# Patient Record
Sex: Female | Born: 1955
Health system: Southern US, Community
[De-identification: ages and names within clinical notes are randomized; demographics above are authoritative.]

## PROBLEM LIST (undated history)

## (undated) DIAGNOSIS — K222 Esophageal obstruction: Secondary | ICD-10-CM

## (undated) DIAGNOSIS — K5790 Diverticulosis of intestine, part unspecified, without perforation or abscess without bleeding: Secondary | ICD-10-CM

## (undated) DIAGNOSIS — F32A Depression, unspecified: Secondary | ICD-10-CM

## (undated) DIAGNOSIS — R413 Other amnesia: Secondary | ICD-10-CM

## (undated) DIAGNOSIS — K219 Gastro-esophageal reflux disease without esophagitis: Secondary | ICD-10-CM

## (undated) DIAGNOSIS — K449 Diaphragmatic hernia without obstruction or gangrene: Secondary | ICD-10-CM

## (undated) DIAGNOSIS — F99 Mental disorder, not otherwise specified: Secondary | ICD-10-CM

## (undated) DIAGNOSIS — E785 Hyperlipidemia, unspecified: Secondary | ICD-10-CM

## (undated) DIAGNOSIS — F329 Major depressive disorder, single episode, unspecified: Secondary | ICD-10-CM

## (undated) HISTORY — DX: Diverticulosis of intestine, part unspecified, without perforation or abscess without bleeding: K57.90

## (undated) HISTORY — DX: Gastro-esophageal reflux disease without esophagitis: K21.9

## (undated) HISTORY — DX: Esophageal obstruction: K22.2

## (undated) HISTORY — PX: BACK SURGERY: SHX140

## (undated) HISTORY — DX: Diaphragmatic hernia without obstruction or gangrene: K44.9

## (undated) HISTORY — PX: TUBAL LIGATION: SHX77

## (undated) HISTORY — PX: PATELLA FRACTURE SURGERY: SHX735

## (undated) HISTORY — DX: Hyperlipidemia, unspecified: E78.5

## (undated) HISTORY — DX: Other amnesia: R41.3

---
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
## 2007-04-03 ENCOUNTER — Encounter: Admission: RE | Admit: 2007-04-03 | Discharge: 2007-04-03 | Payer: Self-pay | Admitting: Family Medicine

## 2008-02-02 ENCOUNTER — Ambulatory Visit (HOSPITAL_COMMUNITY): Admission: RE | Admit: 2008-02-02 | Discharge: 2008-02-02 | Payer: Self-pay | Admitting: Internal Medicine

## 2008-02-14 ENCOUNTER — Ambulatory Visit (HOSPITAL_COMMUNITY): Admission: RE | Admit: 2008-02-14 | Discharge: 2008-02-14 | Payer: Self-pay | Admitting: Internal Medicine

## 2009-03-21 ENCOUNTER — Ambulatory Visit (HOSPITAL_COMMUNITY): Admission: RE | Admit: 2009-03-21 | Discharge: 2009-03-21 | Payer: Self-pay | Admitting: Family Medicine

## 2009-11-10 ENCOUNTER — Emergency Department (HOSPITAL_COMMUNITY): Admission: EM | Admit: 2009-11-10 | Discharge: 2009-11-10 | Payer: Self-pay | Admitting: Emergency Medicine

## 2010-10-18 ENCOUNTER — Encounter: Payer: Self-pay | Admitting: Internal Medicine

## 2011-06-08 ENCOUNTER — Other Ambulatory Visit (HOSPITAL_COMMUNITY): Payer: Self-pay | Admitting: Internal Medicine

## 2011-06-08 DIAGNOSIS — R1310 Dysphagia, unspecified: Secondary | ICD-10-CM

## 2011-06-08 DIAGNOSIS — Z139 Encounter for screening, unspecified: Secondary | ICD-10-CM

## 2011-06-17 ENCOUNTER — Ambulatory Visit (HOSPITAL_COMMUNITY)
Admission: RE | Admit: 2011-06-17 | Discharge: 2011-06-17 | Disposition: A | Discharge status: Home / Self Care | Payer: BC Managed Care – PPO | Source: Ambulatory Visit | Attending: Internal Medicine | Admitting: Internal Medicine

## 2011-06-17 DIAGNOSIS — R1310 Dysphagia, unspecified: Secondary | ICD-10-CM

## 2011-06-17 DIAGNOSIS — Z1231 Encounter for screening mammogram for malignant neoplasm of breast: Secondary | ICD-10-CM | POA: Insufficient documentation

## 2011-06-17 DIAGNOSIS — Z139 Encounter for screening, unspecified: Secondary | ICD-10-CM

## 2011-06-23 ENCOUNTER — Other Ambulatory Visit: Payer: Self-pay | Admitting: Internal Medicine

## 2011-06-23 DIAGNOSIS — R928 Other abnormal and inconclusive findings on diagnostic imaging of breast: Secondary | ICD-10-CM

## 2011-06-24 ENCOUNTER — Ambulatory Visit
Admission: RE | Admit: 2011-06-24 | Discharge: 2011-06-24 | Disposition: A | Discharge status: Home / Self Care | Payer: BC Managed Care – PPO | Source: Ambulatory Visit | Attending: Internal Medicine | Admitting: Internal Medicine

## 2011-06-24 DIAGNOSIS — R928 Other abnormal and inconclusive findings on diagnostic imaging of breast: Secondary | ICD-10-CM

## 2011-09-28 HISTORY — PX: COLONOSCOPY: SHX174

## 2011-10-19 ENCOUNTER — Other Ambulatory Visit (HOSPITAL_COMMUNITY): Payer: Self-pay | Admitting: Physician Assistant

## 2011-10-19 DIAGNOSIS — R1031 Right lower quadrant pain: Secondary | ICD-10-CM

## 2011-10-21 ENCOUNTER — Ambulatory Visit (HOSPITAL_COMMUNITY)
Admission: RE | Admit: 2011-10-21 | Discharge: 2011-10-21 | Disposition: A | Discharge status: Home / Self Care | Payer: BC Managed Care – PPO | Source: Ambulatory Visit | Attending: Physician Assistant | Admitting: Physician Assistant

## 2011-10-21 ENCOUNTER — Encounter (HOSPITAL_COMMUNITY): Payer: Self-pay

## 2011-10-21 ENCOUNTER — Other Ambulatory Visit: Payer: Self-pay

## 2011-10-21 ENCOUNTER — Telehealth: Payer: Self-pay

## 2011-10-21 DIAGNOSIS — Z139 Encounter for screening, unspecified: Secondary | ICD-10-CM

## 2011-10-21 DIAGNOSIS — K5730 Diverticulosis of large intestine without perforation or abscess without bleeding: Secondary | ICD-10-CM | POA: Insufficient documentation

## 2011-10-21 DIAGNOSIS — R1031 Right lower quadrant pain: Secondary | ICD-10-CM | POA: Insufficient documentation

## 2011-10-21 DIAGNOSIS — R197 Diarrhea, unspecified: Secondary | ICD-10-CM | POA: Insufficient documentation

## 2011-10-21 MED ORDER — IOHEXOL 300 MG/ML  SOLN
100.0000 mL | Freq: Once | INTRAMUSCULAR | Status: AC | PRN
  Administered 2011-10-21: 100 mL via INTRAVENOUS

## 2011-10-21 NOTE — Telephone Encounter (Signed)
Reviewed CT and barium esophagram results from last three months. No significant findings.  Patient declines OV prior to TCS.  OK to schedule.

## 2011-10-21 NOTE — Telephone Encounter (Signed)
Gastroenterology Pre-Procedure Form   Request Date: 10/21/2011     Requesting Physician: Dr. Phillips Odor     PATIENT INFORMATION:  Sylvia [name] is a 56 y.o., female (DOB=02-16-1956).  PROCEDURE: Procedure(s) requested: colonoscopy Procedure Reason: screening for colon cancer  PATIENT REVIEW QUESTIONS: The patient reports the following:   1. Diabetes Melitis: no 2. Joint replacements in the past 12 months: no 3. Major health problems in the past 3 months: Yes / abdominal pain/ still having tests done ordered by [facility] physicians (I offered OV prior to tcs for abd pain and she declined/ said [place] doctors are taking care of that 4. Has an artificial valve or MVP:no 5. Has been advised in past to take antibiotics in advance of a procedure like teeth cleaning: no}    MEDICATIONS & ALLERGIES:    Patient reports the following regarding taking any blood thinners:   Plavix? no Aspirin?no Coumadin?  no  Patient confirms/reports the following medications:  Current Outpatient Prescriptions  Medication Sig Dispense Refill  . ciprofloxacin (CIPRO) 500 MG tablet Take 500 mg by mouth 2 (two) times daily. Pt on Cipro now for some stomach problems      . FLUoxetine (PROZAC) 20 MG capsule Take 20 mg by mouth daily.      Marland Kitchen omeprazole (PRILOSEC) 20 MG capsule Take 20 mg by mouth daily.      . rosuvastatin (CRESTOR) 40 MG tablet Take 40 mg by mouth daily.        Patient confirms/reports the following allergies:  No Known Allergies  Patient is appropriate to schedule for requested procedure(s): yes  AUTHORIZATION INFORMATION Primary Insurance:   ID #:   Group #:  Pre-Cert / Auth required Pre-Cert / Auth #:   Secondary Insurance: ID #:   Group #: Pre-Cert / Auth required: Pre-Cert / Auth #:  No orders of the defined types were placed in this encounter.    SCHEDULE INFORMATION: Procedure has been scheduled as follows:  Date: 10/27/2011             Time: 12:15 PM  Location: [facility] Short Stay  This Gastroenterology Pre-Precedure Form is being routed to the following provider(s) for review: R. Roetta Sessions, MD

## 2011-10-22 ENCOUNTER — Encounter (HOSPITAL_COMMUNITY): Payer: Self-pay | Admitting: Pharmacy Technician

## 2011-10-22 NOTE — Telephone Encounter (Signed)
Faxing Rx and instructions to [facility] in [place].

## 2011-10-26 MED ORDER — SODIUM CHLORIDE 0.45 % IV SOLN
Freq: Once | INTRAVENOUS | Status: AC
  Administered 2011-10-27: 1000 mL via INTRAVENOUS

## 2011-10-27 ENCOUNTER — Encounter (HOSPITAL_COMMUNITY): Payer: Self-pay | Admitting: *Deleted

## 2011-10-27 ENCOUNTER — Ambulatory Visit (HOSPITAL_COMMUNITY)
Admission: RE | Admit: 2011-10-27 | Discharge: 2011-10-27 | Disposition: A | Discharge status: Home / Self Care | Payer: BC Managed Care – PPO | Source: Ambulatory Visit | Attending: Internal Medicine | Admitting: Internal Medicine

## 2011-10-27 ENCOUNTER — Encounter (HOSPITAL_COMMUNITY)
Admission: RE | Disposition: A | Discharge status: Home / Self Care | Payer: Self-pay | Source: Ambulatory Visit | Attending: Internal Medicine

## 2011-10-27 DIAGNOSIS — K5730 Diverticulosis of large intestine without perforation or abscess without bleeding: Secondary | ICD-10-CM

## 2011-10-27 DIAGNOSIS — Z1211 Encounter for screening for malignant neoplasm of colon: Secondary | ICD-10-CM

## 2011-10-27 DIAGNOSIS — Z139 Encounter for screening, unspecified: Secondary | ICD-10-CM

## 2011-10-27 HISTORY — DX: Depression, unspecified: F32.A

## 2011-10-27 HISTORY — DX: Mental disorder, not otherwise specified: F99

## 2011-10-27 HISTORY — PX: COLONOSCOPY: SHX5424

## 2011-10-27 HISTORY — DX: Major depressive disorder, single episode, unspecified: F32.9

## 2011-10-27 SURGERY — COLONOSCOPY
Anesthesia: Moderate Sedation

## 2011-10-27 MED ORDER — MIDAZOLAM HCL 5 MG/5ML IJ SOLN
INTRAMUSCULAR | Status: AC
  Filled 2011-10-27: qty 10

## 2011-10-27 MED ORDER — MEPERIDINE HCL 100 MG/ML IJ SOLN
INTRAMUSCULAR | Status: DC | PRN
  Administered 2011-10-27: 25 mg via INTRAVENOUS
  Administered 2011-10-27 (×2): 50 mg via INTRAVENOUS

## 2011-10-27 MED ORDER — MIDAZOLAM HCL 5 MG/5ML IJ SOLN
INTRAMUSCULAR | Status: DC | PRN
  Administered 2011-10-27: 1 mg via INTRAVENOUS
  Administered 2011-10-27 (×2): 2 mg via INTRAVENOUS
  Administered 2011-10-27: 1 mg via INTRAVENOUS

## 2011-10-27 MED ORDER — MEPERIDINE HCL 100 MG/ML IJ SOLN
INTRAMUSCULAR | Status: AC
  Filled 2011-10-27: qty 2

## 2011-10-27 NOTE — H&P (Signed)
  Primary Care Physician:  Cassell Smiles., MD, MD Primary Gastroenterologist:  Dr.   Pre-Procedure History & Physical: HPI:  Sylvia [name] is a 56 y.o. female is here for a screening colonoscopy.   Past Medical History  Diagnosis Date  . S/P appendectomy 1973  . S/P tubal ligation   . Mental disorder   . Depression     Past Surgical History  Procedure Date  . Appendectomy   . Patella fracture surgery     Prior to Admission medications   Medication Sig Start Date End Date Taking? Authorizing Provider  ciprofloxacin (CIPRO) 500 MG tablet Take 500 mg by mouth 2 (two) times daily. Pt on Cipro now for some stomach problems 10/19/11 10/29/11 Yes Historical Provider, MD  FLUoxetine (PROZAC) 20 MG capsule Take 20 mg by mouth daily.   Yes Historical Provider, MD  ibuprofen (ADVIL,MOTRIN) 200 MG tablet Take 400 mg by mouth every 6 (six) hours as needed. For pain   Yes Historical Provider, MD  omeprazole (PRILOSEC) 20 MG capsule Take 20 mg by mouth daily.   Yes Historical Provider, MD  rosuvastatin (CRESTOR) 40 MG tablet Take 40 mg by mouth every evening.    Yes Historical Provider, MD    Allergies as of 10/21/2011  . (No Known Allergies)    History reviewed. No pertinent family history.  History   Social History  . Marital Status: Married    Spouse Name: N/A    Number of Children: N/A  . Years of Education: N/A   Occupational History  . Not on file.   Social History Main Topics  . Smoking status: Current Everyday Smoker -- 0.2 packs/day for 15 years    Types: Cigarettes  . Smokeless tobacco: Not on file  . Alcohol Use: 1.2 oz/week    2 Shots of liquor per week  . Drug Use: No  . Sexually Active:    Other Topics Concern  . Not on file   Social History Narrative  . No narrative on file    Review of Systems: See HPI, otherwise negative ROS  Physical Exam: BP 102/70  Pulse 76  Temp(Src) 98.4 F (36.9 C) (Oral)  Resp 20  SpO2 94% General:   Alert,   Well-developed, well-nourished, pleasant and cooperative in NAD Head:  Normocephalic and atraumatic. Eyes:  Sclera clear, no icterus.   Conjunctiva pink. Ears:  Normal auditory acuity. Nose:  No deformity, discharge,  or lesions. Mouth:  No deformity or lesions, dentition normal. Neck:  Supple; no masses or thyromegaly. Lungs:  Clear throughout to auscultation.   No wheezes, crackles, or rhonchi. No acute distress. Heart:  Regular rate and rhythm; no murmurs, clicks, rubs,  or gallops. Abdomen:  Soft, nontender and nondistended. No masses, hepatosplenomegaly or hernias noted. Normal bowel sounds, without guarding, and without rebound.   Msk:  Symmetrical without gross deformities. Normal posture. Pulses:  Normal pulses noted. Extremities:  Without clubbing or edema. Neurologic:  Alert and  oriented x4;  grossly normal neurologically. Skin:  Intact without significant lesions or rashes. Cervical Nodes:  No significant cervical adenopathy. Psych:  Alert and cooperative. Normal mood and affect.  Impression/Plan: Sylvia [name] is now here to undergo a screening colonoscopy.  Risks, benefits, limitations, imponderables and alternatives regarding colonoscopy have been reviewed with the patient. Questions have been answered. All parties agreeable.

## 2011-10-27 NOTE — H&P (Signed)
Primary Care Physician:  Cassell Smiles., MD, MD Primary Gastroenterologist:  Dr.   Pre-Procedure History & Physical: HPI:  Sylvia [name] is a 56 y.o. female is here for a screening colonoscopy.  First ever had her screening colonoscopy. No blood per rectum, etc. Patient has had some right upper quadrant abdominal pain from time to time. She is being evaluated at [facility]. Recent CT scan demonstrated diverticulosis but nothing acute no evidence of neoplasm. For the record, she was offered an office visit prior to colonoscopy last week with [place] for her  abdominal pain , however, she declined.  Past Medical History  Diagnosis Date  . S/P appendectomy 1973  . S/P tubal ligation   . Mental disorder   . Depression     Past Surgical History  Procedure Date  . Appendectomy   . Patella fracture surgery     Prior to Admission medications   Medication Sig Start Date End Date Taking? Authorizing Provider  ciprofloxacin (CIPRO) 500 MG tablet Take 500 mg by mouth 2 (two) times daily. Pt on Cipro now for some stomach problems 10/19/11 10/29/11 Yes Historical Provider, MD  FLUoxetine (PROZAC) 20 MG capsule Take 20 mg by mouth daily.   Yes Historical Provider, MD  ibuprofen (ADVIL,MOTRIN) 200 MG tablet Take 400 mg by mouth every 6 (six) hours as needed. For pain   Yes Historical Provider, MD  omeprazole (PRILOSEC) 20 MG capsule Take 20 mg by mouth daily.   Yes Historical Provider, MD  rosuvastatin (CRESTOR) 40 MG tablet Take 40 mg by mouth every evening.    Yes Historical Provider, MD    Allergies as of 10/21/2011  . (No Known Allergies)    History reviewed. No pertinent family history.  History   Social History  . Marital Status: Married    Spouse Name: N/A    Number of Children: N/A  . Years of Education: N/A   Occupational History  . Not on file.   Social History Main Topics  . Smoking status: Current Everyday Smoker -- 0.2 packs/day for 15 years    Types:  Cigarettes  . Smokeless tobacco: Not on file  . Alcohol Use: 1.2 oz/week    2 Shots of liquor per week  . Drug Use: No  . Sexually Active:    Other Topics Concern  . Not on file   Social History Narrative  . No narrative on file    Review of Systems: See HPI, otherwise negative ROS  Physical Exam: BP 102/70  Pulse 76  Temp(Src) 98.4 F (36.9 C) (Oral)  Resp 20  SpO2 94% General:   Alert,  Well-developed, well-nourished, pleasant and cooperative in NAD Head:  Normocephalic and atraumatic. Eyes:  Sclera clear, no icterus.   Conjunctiva pink. Ears:  Normal auditory acuity. Nose:  No deformity, discharge,  or lesions. Mouth:  No deformity or lesions, dentition normal. Neck:  Supple; no masses or thyromegaly. Lungs:  Clear throughout to auscultation.   No wheezes, crackles, or rhonchi. No acute distress. Heart:  Regular rate and rhythm; no murmurs, clicks, rubs,  or gallops. Abdomen:  Nondistended. no rash. Positive bowels soft with minimal right upper quadrant abdominal tenderness without mass or organomegaly Msk:  Symmetrical without gross deformities. Normal posture. Pulses:  Normal pulses noted. Extremities:  Without clubbing or edema. Neurologic:  Alert and  oriented x4;  grossly normal neurologically. Skin:  Intact without significant lesions or rashes. Cervical Nodes:  No significant cervical adenopathy. Psych:  Alert and  cooperative. Normal mood and affect.  Impression/Plan: [name] is now here to undergo a screening colonoscopy.  First ever examination.No family history of colon polyps or colon cancer. Recent CT as outlined above. Recent right-sided abdominal pain- etiology not well defined at this time. She wishes to have a colonoscopy but also wishes for her abdominal pain to be worked up further. Regardless of abdominal pain etiology, given CT findings recently, I see no contraindication to proceeding with a screening colonoscopy given that she took her prep  yesterday.  The risks, benefits, limitations, imponderables and alternatives regarding colonoscopy have been reviewed with the patient. Questions have been answered. All parties agreeable.

## 2011-10-27 NOTE — H&P (Signed)
  I have seen & examined the patient prior to the procedure(s) today and reviewed the history and physical/consultation.  There have been no changes.  After consideration of the risks, benefits, alternatives and imponderables, the patient has consented to the procedure(s).   

## 2011-10-27 NOTE — Op Note (Signed)
[facility] [address] [place], [place]  [address]  COLONOSCOPY PROCEDURE REPORT  PATIENT:  Sylvia [name], Sylvia [name]  MR#:  [id number] BIRTHDATE:  07-29-1956, 55 yrs. old  GENDER:  female ENDOSCOPIST:  R. Roetta Sessions, MD FACP [facility] REF. BY:  Artis Delay, M.D. PROCEDURE DATE:  10/27/2011 PROCEDURE:  Colorectal cancer screening INDICATIONS:  First-ever average risk screening examination  INFORMED CONSENT:  The risks, benefits, alternatives and imponderables including but not limited to bleeding, perforation as well as the possibility of a missed lesion have been reviewed. The potential for biopsy, lesion removal, etc. have also been discussed.  Questions have been answered.  All parties agreeable. Please see the history and physical in the medical record for more information.  MEDICATIONS:  Versed 6 mg IV and Demerol 25 mg IV in divided doses  DESCRIPTION OF PROCEDURE:  After a digital rectal exam was performed, the EC-3890Li ([id number]) and EC-3890Li ([id number]) colonoscope was advanced from the anus through the rectum and colon to the area of the cecum, ileocecal valve and appendiceal orifice.  The cecum was deeply intubated.  These structures were well-seen and photographed for the record.  From the level of the cecum and ileocecal valve, the scope was slowly and cautiously withdrawn.  The mucosal surfaces were carefully surveyed utilizing scope tip deflection to facilitate fold flattening as needed.  The scope was pulled down into the rectum where a thorough examination including retroflexion was performed. <<PROCEDUREIMAGES>>  FINDINGS: Adequate preparation. Normal rectum. Scattered sigmoid and descending diverticula; remainder of colonic mucosa as well as the distal         10 cm of terminal ileum mucosa appeared normal  THERAPEUTIC / DIAGNOSTIC MANEUVERS PERFORMED:  None  COMPLICATIONS:  None  CECAL WITHDRAWAL TIME: 12 minutes  IMPRESSION:    Colonic  diverticulosis  RECOMMENDATIONS:    Repeat screening colonoscopy 10 years.  ______________________________ R. Roetta Sessions, MD Caleen Essex  CC:  Artis Delay, M.D.  n. eSIGNED:   R. Roetta Sessions at 10/27/2011 02:28 PM  Georgeanna Lea, [phone]

## 2011-10-28 ENCOUNTER — Ambulatory Visit (INDEPENDENT_AMBULATORY_CARE_PROVIDER_SITE_OTHER): Payer: BC Managed Care – PPO | Admitting: Gastroenterology

## 2011-10-28 ENCOUNTER — Telehealth: Payer: Self-pay | Admitting: Gastroenterology

## 2011-10-28 ENCOUNTER — Encounter: Payer: Self-pay | Admitting: Gastroenterology

## 2011-10-28 DIAGNOSIS — K219 Gastro-esophageal reflux disease without esophagitis: Secondary | ICD-10-CM | POA: Insufficient documentation

## 2011-10-28 DIAGNOSIS — R1013 Epigastric pain: Secondary | ICD-10-CM | POA: Insufficient documentation

## 2011-10-28 DIAGNOSIS — R1011 Right upper quadrant pain: Secondary | ICD-10-CM | POA: Insufficient documentation

## 2011-10-28 NOTE — Assessment & Plan Note (Signed)
Right sided abd pain. Mostly ruq on exam today. Postprandial and positional component to her symptoms. CT A/P failed to show cause of her pain. No obvious abdominal hernia although cannot exclude spigelian hernia. She appreciates more pp component now than previously. No significant colonoscopy findings to explain symptoms.   Discussed with Dr. Jena Gauss. Gallbladder w/u including abd u/s.   Retrieve any labwork done at PCP.

## 2011-10-28 NOTE — Assessment & Plan Note (Signed)
Chronic GERD. No prior EGD. Consider EGD at later date to r/o complication such as Barrett's esophagus. Continue omeprazole.

## 2011-10-28 NOTE — Telephone Encounter (Signed)
Pt is scheduled for [place] on 02/05 @ 10am- she is aware to be NPO after midnight

## 2011-10-28 NOTE — Progress Notes (Signed)
Primary Care Physician: FUSCO,LAWRENCE J., MD, MD  Primary Gastroenterologist:  Michael Rourk, MD   Chief Complaint  Patient presents with  . Abdominal Pain    HPI: Sylvia [name] is a 55 y.o. female here for further evaluation of abdominal pain. She was seen yesterday for the first time, at time of screening colonoscopy. We received her referral forms previously and offered her an appointment as she states she was having abdominal pain. She informed us that she had had multiple test by her PCP already and just needed screening colonoscopy.   On colonoscopy yesterday, she had diverticulosis.   She c/o RLQ pain. She has noted tenderness at her appendectomy scar for nearly one year but since 05/2011 she has had increasing pain. 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 Laying down seems to alleviate it at times. Now however, she has noted a pp component. Pain has migrated more to right mid-abd to RUQ. No n/v. No change in bowels. No melena, brbpr. In 05/2011 she was having difficulty swallowing. Barium swallow failed to show any signficant problems. She saw ENT who told her she had signficant amount of mucous in her nasal passages. Treated with abx for two weeks and symptoms resolved. Chronic GERD for over 20 years. No prior EGD. She feels like she has RLQ bulging. Notes belly fat which she never had but she has gained over 12 pounds since 05/2011. Episode when she reached backwards and developed acute pain in shoulder and rlq worse. Put brace on , worse pain with meals. Feel better with laying down. Worse with prolonged sitting/driving.                                 Current Outpatient Prescriptions on File Prior to Visit  Medication Sig Dispense Refill  . ciprofloxacin (CIPRO) 500 MG tablet Take 500 mg by mouth 2 (two) times daily. Pt on Cipro now for some stomach problems      .  FLUoxetine (PROZAC) 20 MG capsule Take 20 mg by mouth daily.      . omeprazole (PRILOSEC) 20 MG capsule Take 20 mg by mouth daily.      . rosuvastatin (CRESTOR) 40 MG tablet Take 40 mg by mouth every evening.       . ibuprofen (ADVIL,MOTRIN) 200 MG tablet Take 400 mg by mouth every 6 (six) hours as needed. For pain           Allergies as of 10/28/2011  . (No Known Allergies)   Past Medical History  Diagnosis Date  . Mental disorder   . Depression   . GERD (gastroesophageal reflux disease)    Past Surgical History  Procedure Date  . Appendectomy 1973  . Patella fracture surgery   . Tubal ligation   . Colonoscopy 09/2011    colonic diverticulosis. Normal TI to 10cm. Next TCS 09/2021   Family History  Problem Relation Age of Onset  . Colon cancer Neg Hx    History  Substance Use Topics  . Smoking status: Current Everyday Smoker -- 0.2 packs/day for 15 years    Types: Cigarettes  . Smokeless tobacco: Not on file   Comment: 7-8 cigaretts  . Alcohol Use: 1.2 oz/week    2 Shots of liquor per week     Infrequent.    ROS:    General: Negative for anorexia, weight loss, fever, chills, fatigue, weakness. ENT: Negative for hoarseness, difficulty swallowing , nasal congestion. Recent h/o abnormal sinus/nasal exam by ENT.  CV: Negative for chest pain, angina, palpitations, dyspnea on exertion, peripheral edema.  Respiratory: Negative for dyspnea at rest, dyspnea on exertion, cough, sputum, wheezing.  GI: See history of present illness. GU:  Negative for dysuria, hematuria, urinary incontinence, urinary frequency, nocturnal urination.  Endo: Negative for unusual weight change.    Physical Examination:   BP 115/81  Pulse 72  Temp(Src) 97.6 F (36.4 C) (Temporal)  Ht 5' 1" (1.549 m)  Wt 138 lb 9.6 oz (62.869 kg)  BMI 26.19 kg/m2  General: Well-nourished, well-developed in no acute distress. Accompanied by spouse.  Eyes: No icterus. Mouth: Oropharyngeal mucosa moist and pink  , no lesions erythema or exudate. Lungs: Clear to auscultation bilaterally.  Heart: Regular rate and rhythm, no murmurs rubs or gallops.  Abdomen: Bowel sounds are normal. Tender from epigastrium/ruq to rlq. No hernia noted. No rebound or guarding. No masses. Nondistended, no hepatosplenomegaly or masses, no abdominal bruits or hernia , no rebound or guarding.   Extremities: No lower extremity edema. No clubbing or deformities. Neuro: Alert and oriented x 4   Skin: Warm and dry, no jaundice.   Psych: Alert and cooperative, normal mood and affect.   Imaging Studies: Ct Abdomen Pelvis W Contrast  10/21/2011  *RADIOLOGY REPORT*  Clinical Data: 55-year-old female with right lower quadrant pain times 3 months.  Diarrhea.  Appendectomy in 1973.  Prior tubal ligation.  CT ABDOMEN AND PELVIS WITH CONTRAST  Technique:  Multidetector CT imaging of the abdomen and pelvis was performed following the standard protocol during bolus administration of intravenous contrast.  Contrast: 100mL OMNIPAQUE IOHEXOL 300 MG/ML IV SOLN  Comparison: 02/02/2008.  Findings: Chronic scarring at the right lung base is stable.  No pericardial or pleural effusion.  Lumbar facet degeneration. No acute osseous abnormality identified.  Oral contrast has reached the rectum.  No pelvic free fluid. Uterus and adnexa are within normal limits.  Unremarkable bladder. No distal bowel inflammatory changes although there are is a sigmoid and distal descending colon diverticulosis.  More proximal colon is within normal limits.  Normal terminal ileum.  No dilated small bowel.  Chronic small hiatal hernia.  Otherwise negative stomach.  Negative duodenum.  Chronic calcified areas in the right lateral hepatic lobe are unchanged.  Liver enhancement is within normal limits.  Negative gallbladder, spleen, pancreas, adrenal glands, and portal venous system.  Major arterial structures are patent.  There is atherosclerosis of the aorta and iliac arteries.  Kidneys are within normal limits.  No abdominal free fluid or lymphadenopathy.  IMPRESSION: No acute or inflammatory findings in the abdomen or pelvis. Chronic sigmoid diverticulosis. Chronic benign appearing hepatic calcifications.  Original Report Authenticated By: H.LEE HALL III, M.D.        

## 2011-10-28 NOTE — Telephone Encounter (Signed)
Message copied by Irish Elders on Thu Oct 28, 2011  2:35 PM ------      Message from: Tiffany Kocher      Created: Thu Oct 28, 2011  1:22 PM       Shalita Notte,            Please schedule patient for abd u/s. I put order in after patient left office today. Let her know, RMR has recommended this.

## 2011-10-28 NOTE — Progress Notes (Signed)
Faxed to PCP

## 2011-11-02 ENCOUNTER — Ambulatory Visit (HOSPITAL_COMMUNITY)
Admit: 2011-11-02 | Discharge: 2011-11-02 | Disposition: A | Discharge status: Home / Self Care | Payer: BC Managed Care – PPO | Source: Ambulatory Visit | Attending: Gastroenterology | Admitting: Gastroenterology

## 2011-11-02 DIAGNOSIS — R1013 Epigastric pain: Secondary | ICD-10-CM | POA: Insufficient documentation

## 2011-11-02 DIAGNOSIS — R1011 Right upper quadrant pain: Secondary | ICD-10-CM

## 2011-11-03 ENCOUNTER — Encounter (HOSPITAL_COMMUNITY): Payer: Self-pay | Admitting: Internal Medicine

## 2011-11-04 NOTE — Progress Notes (Signed)
Quick Note:  Please let patient know gb "looks" good on u/s. No stones or acute infection/inflammation. Per Dr. Jena Gauss, need to finish ruling out gb, need to ruloe out gb dysfunction.  HIDA with fatty meal challenge. ______

## 2011-11-05 ENCOUNTER — Other Ambulatory Visit: Payer: Self-pay | Admitting: Gastroenterology

## 2011-11-05 DIAGNOSIS — R109 Unspecified abdominal pain: Secondary | ICD-10-CM

## 2011-11-05 DIAGNOSIS — R1011 Right upper quadrant pain: Secondary | ICD-10-CM

## 2011-11-09 ENCOUNTER — Encounter (HOSPITAL_COMMUNITY): Payer: Self-pay

## 2011-11-09 ENCOUNTER — Encounter (HOSPITAL_COMMUNITY)
Admission: RE | Admit: 2011-11-09 | Discharge: 2011-11-09 | Disposition: A | Discharge status: Home / Self Care | Payer: BC Managed Care – PPO | Source: Ambulatory Visit | Attending: Internal Medicine | Admitting: Internal Medicine

## 2011-11-09 DIAGNOSIS — R1011 Right upper quadrant pain: Secondary | ICD-10-CM | POA: Insufficient documentation

## 2011-11-09 MED ORDER — TECHNETIUM TC 99M MEBROFENIN IV KIT
5.0000 | PACK | Freq: Once | INTRAVENOUS | Status: AC | PRN
  Administered 2011-11-09: 4.8 via INTRAVENOUS

## 2011-11-09 MED ORDER — SINCALIDE 5 MCG IJ SOLR
0.0200 ug/kg | Freq: Once | INTRAMUSCULAR | Status: AC
  Administered 2011-11-09: 1.26 ug via INTRAVENOUS

## 2011-11-12 ENCOUNTER — Telehealth: Payer: Self-pay

## 2011-11-12 NOTE — Telephone Encounter (Signed)
Pt would like to know the results of her HIDA  Scan today if possible. Please advise

## 2011-11-12 NOTE — Telephone Encounter (Signed)
Pt would like for you to call her she has a lot of questions. She need to go back to work.

## 2011-11-12 NOTE — Progress Notes (Signed)
Quick Note:  See telephone note from today. GB w/u negative. ?MS component to pain plus long h/o GERD and pp component to ruq pain. Recommend ASAP appt with RMR for evaluation. ______

## 2011-11-12 NOTE — Telephone Encounter (Signed)
GB work up negative.  Her symptoms really don't fit any to any one category. ?musculoskeletal component as well. She does have chronic GERD issues and pp ruq pain. No prior EGD.   I would recommend ASAP RMR appointment to evaluate Sylvia [name]. Can discuss next step in work-up ?egd.

## 2011-11-12 NOTE — Telephone Encounter (Signed)
Spoke with patient for fifteen minutes regarding symptoms and previous w/u.  Essentially she has MS component to pain and pp upper abd pain, chronic GERD. Suspect she will need EGD to wrap up GI w/u for now. To discuss with Dr. 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

## 2011-11-16 ENCOUNTER — Other Ambulatory Visit: Payer: Self-pay | Admitting: Gastroenterology

## 2011-11-16 DIAGNOSIS — K219 Gastro-esophageal reflux disease without esophagitis: Secondary | ICD-10-CM

## 2011-11-16 NOTE — Telephone Encounter (Signed)
Please let pt know. Discussed with Dr. Jena Gauss. He agrees with EGD. Please schedule ASAP. 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

## 2011-11-16 NOTE — Telephone Encounter (Signed)
EGD scheduled for 02/21- Instructions emailed to patient

## 2011-11-17 MED ORDER — SODIUM CHLORIDE 0.45 % IV SOLN
Freq: Once | INTRAVENOUS | Status: AC
  Administered 2011-11-18: 1000 mL via INTRAVENOUS

## 2011-11-18 ENCOUNTER — Encounter (HOSPITAL_COMMUNITY): Payer: Self-pay | Admitting: *Deleted

## 2011-11-18 ENCOUNTER — Ambulatory Visit (HOSPITAL_COMMUNITY)
Admission: RE | Admit: 2011-11-18 | Discharge: 2011-11-18 | Disposition: A | Discharge status: Home / Self Care | Payer: BC Managed Care – PPO | Source: Ambulatory Visit | Attending: Internal Medicine | Admitting: Internal Medicine

## 2011-11-18 ENCOUNTER — Encounter (HOSPITAL_COMMUNITY)
Admission: RE | Disposition: A | Discharge status: Home / Self Care | Payer: Self-pay | Source: Ambulatory Visit | Attending: Internal Medicine

## 2011-11-18 DIAGNOSIS — K219 Gastro-esophageal reflux disease without esophagitis: Secondary | ICD-10-CM

## 2011-11-18 DIAGNOSIS — K449 Diaphragmatic hernia without obstruction or gangrene: Secondary | ICD-10-CM

## 2011-11-18 DIAGNOSIS — K222 Esophageal obstruction: Secondary | ICD-10-CM | POA: Insufficient documentation

## 2011-11-18 DIAGNOSIS — K2100 Gastro-esophageal reflux disease with esophagitis, without bleeding: Secondary | ICD-10-CM

## 2011-11-18 DIAGNOSIS — R109 Unspecified abdominal pain: Secondary | ICD-10-CM | POA: Insufficient documentation

## 2011-11-18 HISTORY — PX: ESOPHAGOGASTRODUODENOSCOPY: SHX5428

## 2011-11-18 HISTORY — PX: ESOPHAGOGASTRODUODENOSCOPY: SHX1529

## 2011-11-18 SURGERY — EGD (ESOPHAGOGASTRODUODENOSCOPY)
Anesthesia: Moderate Sedation

## 2011-11-18 MED ORDER — MIDAZOLAM HCL 5 MG/5ML IJ SOLN
INTRAMUSCULAR | Status: DC | PRN
  Administered 2011-11-18: 2 mg via INTRAVENOUS
  Administered 2011-11-18 (×3): 1 mg via INTRAVENOUS

## 2011-11-18 MED ORDER — MEPERIDINE HCL 100 MG/ML IJ SOLN
INTRAMUSCULAR | Status: AC
  Filled 2011-11-18: qty 2

## 2011-11-18 MED ORDER — BUTAMBEN-TETRACAINE-BENZOCAINE 2-2-14 % EX AERO
INHALATION_SPRAY | CUTANEOUS | Status: DC | PRN
  Administered 2011-11-18: 2 via TOPICAL

## 2011-11-18 MED ORDER — MEPERIDINE HCL 100 MG/ML IJ SOLN
INTRAMUSCULAR | Status: DC | PRN
  Administered 2011-11-18 (×2): 25 mg via INTRAVENOUS
  Administered 2011-11-18: 50 mg via INTRAVENOUS

## 2011-11-18 MED ORDER — MIDAZOLAM HCL 5 MG/5ML IJ SOLN
INTRAMUSCULAR | Status: AC
  Filled 2011-11-18: qty 10

## 2011-11-18 MED ORDER — RABEPRAZOLE SODIUM 20 MG PO TBEC: 20.0000 mg | DELAYED_RELEASE_TABLET | Freq: Every day | ORAL | Status: DC

## 2011-11-18 MED ORDER — STERILE WATER FOR IRRIGATION IR SOLN
Status: DC | PRN
  Administered 2011-11-18: 09:00:00

## 2011-11-18 NOTE — Discharge Instructions (Signed)
EGD Discharge instructions Please read the instructions outlined below and refer to this sheet in the next few weeks. These discharge instructions provide you with general information on caring for yourself after you leave the hospital. Your doctor may also give you specific instructions. While your treatment has been planned according to the most current medical practices available, unavoidable complications occasionally occur. If you have any problems or questions after discharge, please call your doctor. ACTIVITY  You may resume your regular activity but move at a slower pace for the next 24 hours.   Take frequent rest periods for the next 24 hours.   Walking will help expel (get rid of) the air and reduce the bloated feeling in your abdomen.   No driving for 24 hours (because of the anesthesia (medicine) used during the test).   You may shower.   Do not sign any important legal documents or operate any machinery for 24 hours (because of the anesthesia used during the test).  NUTRITION  Drink plenty of fluids.   You may resume your normal diet.   Begin with a light meal and progress to your normal diet.   Avoid alcoholic beverages for 24 hours or as instructed by your caregiver.  MEDICATIONS  You may resume your normal medications unless your caregiver tells you otherwise.  WHAT YOU CAN EXPECT TODAY  You may experience abdominal discomfort such as a feeling of fullness or "gas" pains.  FOLLOW-UP  Your doctor will discuss the results of your test with you.  SEEK IMMEDIATE MEDICAL ATTENTION IF ANY OF THE FOLLOWING OCCUR:  Excessive nausea (feeling sick to your stomach) and/or vomiting.   Severe abdominal pain and distention (swelling).   Trouble swallowing.   Temperature over 101 F (37.8 C).   Rectal bleeding or vomiting of blood.   You have reflux esophagitis (acid burns in your esophagus from reflux)  GERD information provided  Stop Prilosec; begin AcipHex 20 mg  daily-use coupon for free medication for 14 days; however, will need to continue this medication chronically after the 14 days worth of free medication are used.  Use over-the-counter digestive advantage for gas and bloating for your abdominal discomfort and belching.  Office visit with [place] in 3 months. Esophagogastroduodenoscopy This is an endoscopic procedure (a procedure that uses a device like a flexible telescope) that allows your caregiver to view the upper stomach and small bowel. This test allows your caregiver to look at the esophagus. The esophagus carries food from your mouth to your stomach. They can also look at your duodenum. This is the first part of the small intestine that attaches to the stomach. This test is used to detect problems in the bowel such as ulcers and inflammation. PREPARATION FOR TEST Nothing to eat after midnight the day before the test. NORMAL FINDINGS Normal esophagus, stomach, and duodenum. Ranges for normal findings may vary among different laboratories and hospitals. You should always check with your doctor after having lab work or other tests done to discuss the meaning of your test results and whether your values are considered within normal limits. MEANING OF TEST  Your caregiver will go over the test results with you and discuss the importance and meaning of your results, as well as treatment options and the need for additional tests if necessary. OBTAINING THE TEST RESULTS It is your responsibility to obtain your test results. Ask the lab or department performing the test when and how you will get your results. Document Released: 01/14/2005 Document  Revised: 05/26/2011 Document Reviewed: 08/23/2008 [facility] Patient Information 2012 [place], [place].  Gastroesophageal Reflux Disease, Adult Gastroesophageal reflux disease (GERD) happens when acid from your stomach goes into your food pipe (esophagus). The acid can cause a burning feeling in your chest. Over  time, the acid can make small holes (ulcers) in your food pipe.  HOME CARE  Ask your doctor for advice about:   Losing weight.   Quitting smoking.   Alcohol use.   Avoid foods and drinks that make your problems worse. You may want to avoid:   Caffeine and alcohol.   Chocolate.   Mints.   Garlic and onions.   Spicy foods.   Citrus fruits, such as oranges, lemons, or limes.   Foods that contain tomato, such as sauce, chili, salsa, and pizza.   Fried and fatty foods.   Avoid lying down for 3 hours before you go to bed or before you take a nap.   Eat small meals often, instead of large meals.   Wear loose-fitting clothing. Do not wear anything tight around your waist.   Raise (elevate) the head of your bed 6 to 8 inches with wood blocks. Using extra pillows does not help.   Only take medicines as told by your doctor.   Do not take aspirin or ibuprofen.  GET HELP RIGHT AWAY IF:   You have pain in your arms, neck, jaw, teeth, or back.   Your pain gets worse or changes.   You feel sick to your stomach (nauseous), throw up (vomit), or sweat (diaphoresis).   You feel short of breath, or you pass out (faint).   Your throw up is green, yellow, black, or looks like coffee grounds or blood.   Your poop (stool) is red, bloody, or black.  MAKE SURE YOU:   Understand these instructions.   Will watch your condition.   Will get help right away if you are not doing well or get worse.  Document Released: 03/01/2008 Document Revised: 05/26/2011 Document Reviewed: 04/02/2011 [facility] Patient Information 2012 [place], [place].

## 2011-11-18 NOTE — Op Note (Signed)
[facility] [address] [place], [place]  [address]  ENDOSCOPY PROCEDURE REPORT  PATIENT:  Sylvia, [name]  MR#:  [id number] BIRTHDATE:  June 29, 1956, 55 yrs. old  GENDER:  female  ENDOSCOPIST:  R. Roetta Sessions, MD Caleen Essex Referred by:  Artis Delay, M.D.  PROCEDURE DATE:  11/18/2011 PROCEDURE:  diagnostic EGD  indications:    GERD abdominal pain-recently improve spontaneously. Her symptoms refractory to Prilosec 20 mg daily  INFORMED CONSENT:   The risks, benefits, limitations, alternatives and imponderables have been discussed.  The potential for biopsy, esophogeal dilation, etc. have also been reviewed.  Questions have been answered.  All parties agreeable.  Please see the history and physical in the medical record for more information.  MEDICATIONS:Demerol 100 mg IV and Versed 5 mg IV in divided doses. Cetacaine spray.  DESCRIPTION OF PROCEDURE:   The EG-[id number] ([id number]) endoscope was introduced through the mouth and advanced to the second portion of the duodenum without difficulty or limitations.  The mucosal surfaces were surveyed very carefully during advancement of the scope and upon withdrawal.  Retroflexion view of the proximal stomach and esophagogastric junction was performed.  <<PROCEDUREIMAGES>>  FINDINGS:  nearly 5 cm linear erosion coming from the EG junction - see images 7 noncritical Schatzki's ring. Small hiatal hernia. Otherwise normal stomach, first and second portion of the duodenum.  THERAPEUTIC / DIAGNOSTIC MANEUVERS PERFORMED:   none  COMPLICATIONS:   None  IMPRESSION:   Erosive reflux esophagitis-noncritical Schatzki ring - not manipulated. Hiatal hernia.  RECOMMENDATIONS:    Stop omeprazole; begin AcipHex 20 mg orally daily. Given coupon for two-week supply of free medication from the drug store.  Use digestive advantage for gas bloat daily to help with nonspecific abdominal pain gas and bloating. Office visit with [place] in 3  months.  ______________________________ R. Roetta Sessions, MD Caleen Essex  CC:  n. eSIGNED:   R. Roetta Sessions at 11/18/2011 09:21 AM  Georgeanna Lea, [phone]

## 2011-11-18 NOTE — H&P (View-Only) (Signed)
Primary Care Physician: Cassell Smiles., MD, MD  Primary Gastroenterologist:  Roetta Sessions, MD   Chief Complaint  Patient presents with  . Abdominal Pain    HPI: Sylvia [name] is a 56 y.o. female here for further evaluation of abdominal pain. She was seen yesterday for the first time, at time of screening colonoscopy. We received her referral forms previously and offered her an appointment as she states she was having abdominal pain. She informed us that she had had multiple test by her PCP already and just needed screening colonoscopy.   On colonoscopy yesterday, she had diverticulosis.   She c/o RLQ pain. She has noted tenderness at her appendectomy scar for nearly one year but since 05/2011 she has had increasing pain. 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 Laying down seems to alleviate it at times. Now however, she has noted a pp component. Pain has migrated more to right mid-abd to RUQ. No n/v. No change in bowels. No melena, brbpr. In 05/2011 she was having difficulty swallowing. Barium swallow failed to show any signficant problems. She saw ENT who told her she had signficant amount of mucous in her nasal passages. Treated with abx for two weeks and symptoms resolved. Chronic GERD for over 20 years. No prior EGD. She feels like she has RLQ bulging. Notes belly fat which she never had but she has gained over 12 pounds since 05/2011. Episode when she reached backwards and developed acute pain in shoulder and rlq worse. Put brace on , worse pain with meals. Feel better with laying down. Worse with prolonged sitting/driving.                                 Current Outpatient Prescriptions on File Prior to Visit  Medication Sig Dispense Refill  . ciprofloxacin (CIPRO) 500 MG tablet Take 500 mg by mouth 2 (two) times daily. Pt on Cipro now for some stomach problems      .  FLUoxetine (PROZAC) 20 MG capsule Take 20 mg by mouth daily.      Marland Kitchen omeprazole (PRILOSEC) 20 MG capsule Take 20 mg by mouth daily.      . rosuvastatin (CRESTOR) 40 MG tablet Take 40 mg by mouth every evening.       Marland Kitchen ibuprofen (ADVIL,MOTRIN) 200 MG tablet Take 400 mg by mouth every 6 (six) hours as needed. For pain           Allergies as of 10/28/2011  . (No Known Allergies)   Past Medical History  Diagnosis Date  . Mental disorder   . Depression   . GERD (gastroesophageal reflux disease)    Past Surgical History  Procedure Date  . Appendectomy 1973  . Patella fracture surgery   . Tubal ligation   . Colonoscopy 09/2011    colonic diverticulosis. Normal TI to 10cm. Next TCS 09/2021   Family History  Problem Relation Age of Onset  . Colon cancer Neg Hx    History  Substance Use Topics  . Smoking status: Current Everyday Smoker -- 0.2 packs/day for 15 years    Types: Cigarettes  . Smokeless tobacco: Not on file   Comment: 7-8 cigaretts  . Alcohol Use: 1.2 oz/week    2 Shots of liquor per week     Infrequent.    ROS:  General: Negative for anorexia, weight loss, fever, chills, fatigue, weakness. ENT: Negative for hoarseness, difficulty swallowing , nasal congestion. Recent h/o abnormal sinus/nasal exam by ENT.  CV: Negative for chest pain, angina, palpitations, dyspnea on exertion, peripheral edema.  Respiratory: Negative for dyspnea at rest, dyspnea on exertion, cough, sputum, wheezing.  GI: See history of present illness. GU:  Negative for dysuria, hematuria, urinary incontinence, urinary frequency, nocturnal urination.  Endo: Negative for unusual weight change.    Physical Examination:   BP 115/81  Pulse 72  Temp(Src) 97.6 F (36.4 C) (Temporal)  Ht 5\' 1"  (1.549 m)  Wt 138 lb 9.6 oz (62.869 kg)  BMI 26.19 kg/m2  General: Well-nourished, well-developed in no acute distress. Accompanied by spouse.  Eyes: No icterus. Mouth: Oropharyngeal mucosa moist and pink  , no lesions erythema or exudate. Lungs: Clear to auscultation bilaterally.  Heart: Regular rate and rhythm, no murmurs rubs or gallops.  Abdomen: Bowel sounds are normal. Tender from epigastrium/ruq to rlq. No hernia noted. No rebound or guarding. No masses. Nondistended, no hepatosplenomegaly or masses, no abdominal bruits or hernia , no rebound or guarding.   Extremities: No lower extremity edema. No clubbing or deformities. Neuro: Alert and oriented x 4   Skin: Warm and dry, no jaundice.   Psych: Alert and cooperative, normal mood and affect.   Imaging Studies: Ct Abdomen Pelvis W Contrast  10/21/2011  *RADIOLOGY REPORT*  Clinical Data: 56 year old female with right lower quadrant pain times 3 months.  Diarrhea.  Appendectomy in 1973.  Prior tubal ligation.  CT ABDOMEN AND PELVIS WITH CONTRAST  Technique:  Multidetector CT imaging of the abdomen and pelvis was performed following the standard protocol during bolus administration of intravenous contrast.  Contrast: OMNIPAQUE IOHEXOL 300 MG/ML IV SOLN  Comparison: 02/02/2008.  Findings: Chronic scarring at the right lung base is stable.  No pericardial or pleural effusion.  Lumbar facet degeneration. No acute osseous abnormality identified.  Oral contrast has reached the rectum.  No pelvic free fluid. Uterus and adnexa are within normal limits.  Unremarkable bladder. No distal bowel inflammatory changes although there are is a sigmoid and distal descending colon diverticulosis.  More proximal colon is within normal limits.  Normal terminal ileum.  No dilated small bowel.  Chronic small hiatal hernia.  Otherwise negative stomach.  Negative duodenum.  Chronic calcified areas in the right lateral hepatic lobe are unchanged.  Liver enhancement is within normal limits.  Negative gallbladder, spleen, pancreas, adrenal glands, and portal venous system.  Major arterial structures are patent.  There is atherosclerosis of the aorta and iliac arteries.  Kidneys are within normal limits.  No abdominal free fluid or lymphadenopathy.  IMPRESSION: No acute or inflammatory findings in the abdomen or pelvis. Chronic sigmoid diverticulosis. Chronic benign appearing hepatic calcifications.  Original Report Authenticated By: Harley Hallmark, M.D.

## 2011-11-18 NOTE — Interval H&P Note (Signed)
History and Physical Interval Note:  11/18/2011 8:42 AM  Sylvia [name]  has presented today for surgery, with the diagnosis of GERD  The various methods of treatment have been discussed with the patient and family. After consideration of risks, benefits and other options for treatment, the patient has consented to  Procedure(s) (LRB): ESOPHAGOGASTRODUODENOSCOPY (EGD) (N/A) as a surgical intervention .  The patients' history has been reviewed, patient examined, no change in status, stable for surgery.  I have reviewed the patients' chart and labs.  Questions were answered to the patient's satisfaction.     Eula Listen

## 2011-11-23 ENCOUNTER — Ambulatory Visit: Payer: BC Managed Care – PPO | Admitting: Internal Medicine

## 2011-11-23 ENCOUNTER — Telehealth: Payer: Self-pay

## 2011-11-23 NOTE — Telephone Encounter (Signed)
The saving card did not work. Her insurance said that she would have to try Nexium or Protonix  First before they would let her go with the Aciphex.

## 2011-11-23 NOTE — Telephone Encounter (Signed)
Pt called this morning stating that she can not afford the Aciphex. She has 3 bottles of the omeprazole it did not work that well but was wanting to know if she up it to Bid. We are going to give her a savings card to try first.

## 2011-11-24 ENCOUNTER — Encounter (HOSPITAL_COMMUNITY): Payer: Self-pay | Admitting: Internal Medicine

## 2011-11-25 NOTE — Telephone Encounter (Signed)
Patient can try twice a day omeprazole but suspect AcipHex will do  the best job for her in the long run.

## 2011-11-29 NOTE — Telephone Encounter (Signed)
Pt will try to take the omeprazole Bid. She will be changing insurance in 3 months and then we will try to see if they will pay for her Acpihex.

## 2011-12-01 ENCOUNTER — Other Ambulatory Visit: Payer: Self-pay | Admitting: Gastroenterology

## 2011-12-01 MED ORDER — PANTOPRAZOLE SODIUM 40 MG PO TBEC: 40.0000 mg | DELAYED_RELEASE_TABLET | Freq: Every day | ORAL | Status: DC

## 2011-12-06 ENCOUNTER — Telehealth: Payer: Self-pay

## 2011-12-06 NOTE — Telephone Encounter (Signed)
Pt came by office- protonix is causing her to have dizziness and nervousness. She doesn't want to take anymore. Spoke with [facility]- ok to give #15 nexium samples. If they dont work will try to do PA for aciphex.

## 2011-12-06 NOTE — Telephone Encounter (Signed)
Noted  

## 2011-12-15 ENCOUNTER — Telehealth: Payer: Self-pay | Admitting: Internal Medicine

## 2011-12-15 NOTE — Telephone Encounter (Signed)
Patient says the Nexium samples is working well and she needs a Rx called in to [facility]

## 2011-12-15 NOTE — Telephone Encounter (Signed)
Routed to refill box 

## 2011-12-16 MED ORDER — ESOMEPRAZOLE MAGNESIUM 40 MG PO CPDR: 40.0000 mg | DELAYED_RELEASE_CAPSULE | Freq: Every day | ORAL | Status: DC

## 2011-12-16 NOTE — Telephone Encounter (Signed)
Addended by: Tiffany Kocher on: 12/16/2011 09:36 AM   Modules accepted: Orders

## 2012-02-15 ENCOUNTER — Ambulatory Visit: Payer: BC Managed Care – PPO | Admitting: Gastroenterology

## 2012-02-15 ENCOUNTER — Ambulatory Visit: Payer: BC Managed Care – PPO | Admitting: Urgent Care

## 2012-03-15 ENCOUNTER — Other Ambulatory Visit: Payer: Self-pay

## 2012-03-15 MED ORDER — ESOMEPRAZOLE MAGNESIUM 40 MG PO CPDR: 40.0000 mg | DELAYED_RELEASE_CAPSULE | Freq: Every day | ORAL | Status: DC

## 2012-03-22 ENCOUNTER — Other Ambulatory Visit: Payer: Self-pay | Admitting: Gastroenterology

## 2012-03-22 MED ORDER — ESOMEPRAZOLE MAGNESIUM 40 MG PO CPDR: 40.0000 mg | DELAYED_RELEASE_CAPSULE | Freq: Every day | ORAL | Status: DC

## 2012-06-12 ENCOUNTER — Other Ambulatory Visit (HOSPITAL_COMMUNITY): Payer: Self-pay | Admitting: Internal Medicine

## 2012-06-12 DIAGNOSIS — Z139 Encounter for screening, unspecified: Secondary | ICD-10-CM

## 2012-06-19 ENCOUNTER — Ambulatory Visit (HOSPITAL_COMMUNITY)
Admission: RE | Admit: 2012-06-19 | Discharge: 2012-06-19 | Disposition: A | Discharge status: Home / Self Care | Payer: BC Managed Care – PPO | Source: Ambulatory Visit | Attending: Internal Medicine | Admitting: Internal Medicine

## 2012-06-19 DIAGNOSIS — Z139 Encounter for screening, unspecified: Secondary | ICD-10-CM

## 2012-06-19 DIAGNOSIS — Z13820 Encounter for screening for osteoporosis: Secondary | ICD-10-CM | POA: Insufficient documentation

## 2012-06-19 DIAGNOSIS — Z780 Asymptomatic menopausal state: Secondary | ICD-10-CM | POA: Insufficient documentation

## 2012-06-19 DIAGNOSIS — Z1231 Encounter for screening mammogram for malignant neoplasm of breast: Secondary | ICD-10-CM | POA: Insufficient documentation

## 2012-06-19 DIAGNOSIS — M899 Disorder of bone, unspecified: Secondary | ICD-10-CM | POA: Insufficient documentation

## 2013-09-27 IMAGING — CT CT ABD-PELV W/ CM
2 of 5 series · 16 of 46 positions shown, 18 images · IV contrast (Omnipaque 300)
Comparison: 02/02/2008.

CLINICAL DATA: 55-year-old female with right lower quadrant pain
times 3 months.  Diarrhea.  Appendectomy in 9288.  Prior tubal
ligation.

CT ABDOMEN AND PELVIS WITH CONTRAST
TECHNIQUE: Multidetector CT imaging of the abdomen and pelvis was
performed following the standard protocol during bolus
administration of intravenous contrast.
Contrast: 100mL OMNIPAQUE IOHEXOL 300 MG/ML IV SOLN

[Series 2: abd_pel_with 5.0 b40f · axial · 0.67mm/px · z∈[-464,-90]mm · 13 of 85 slices shown, 15 images]
[im 5/85  soft-tissue]
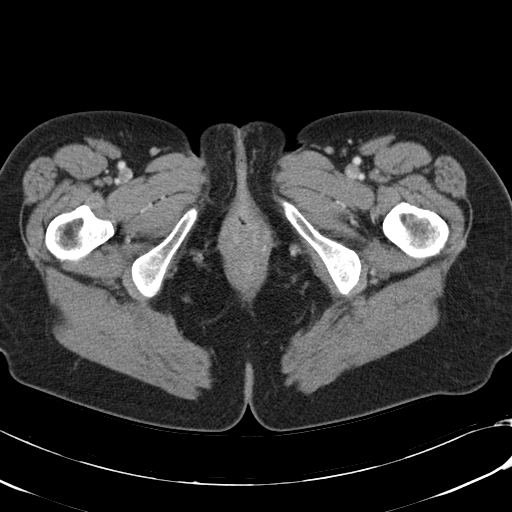
[im 5/85  bone]
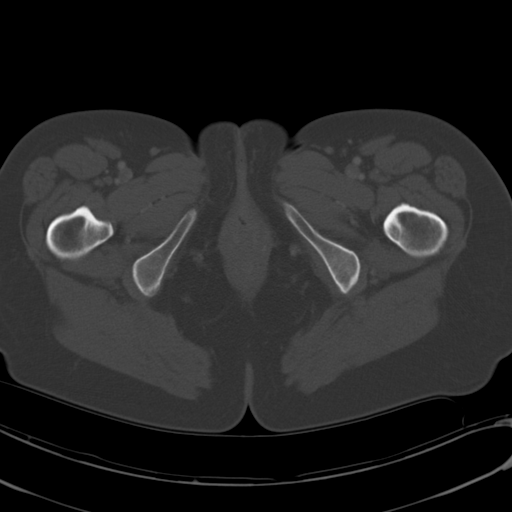
[im 10/85  soft-tissue]
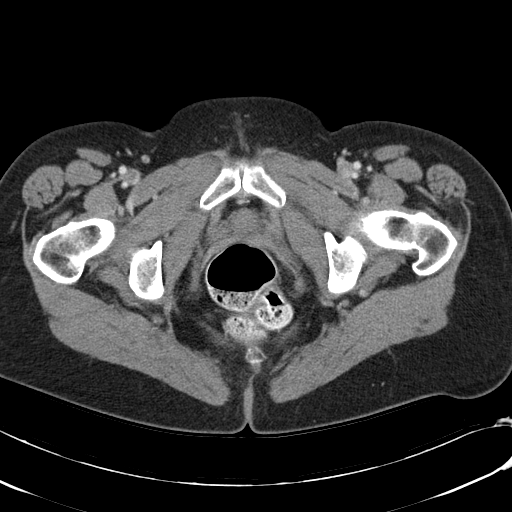
[im 19/85  soft-tissue]
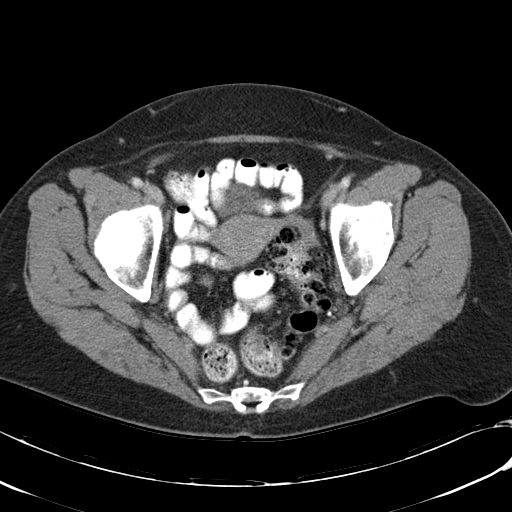
[im 24/85  soft-tissue]
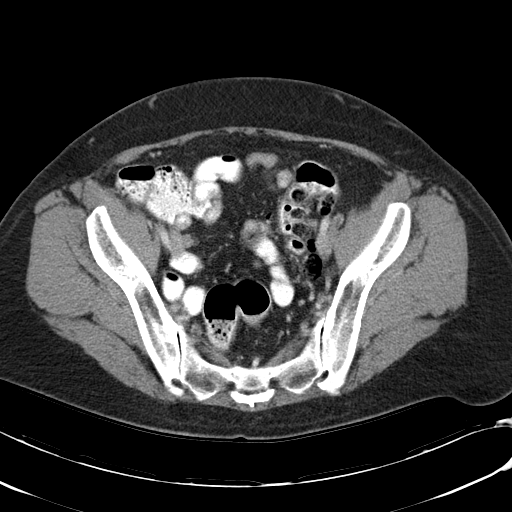
[im 29/85  soft-tissue]
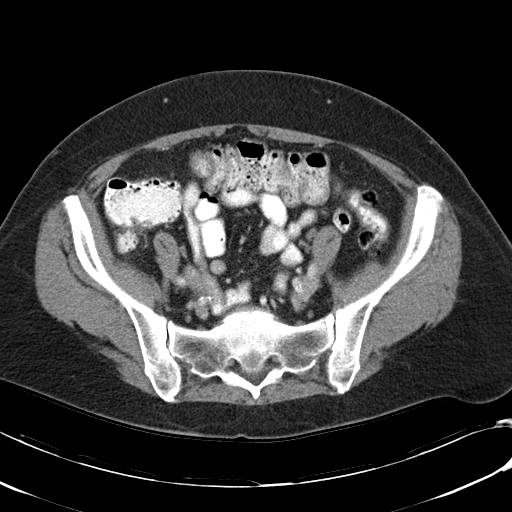
[im 38/85  soft-tissue]
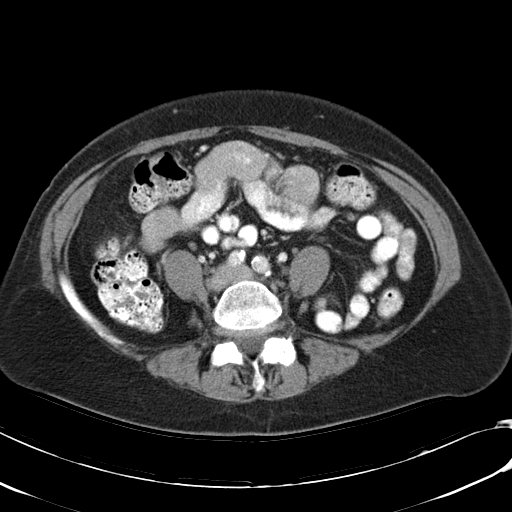
[im 43/85  soft-tissue]
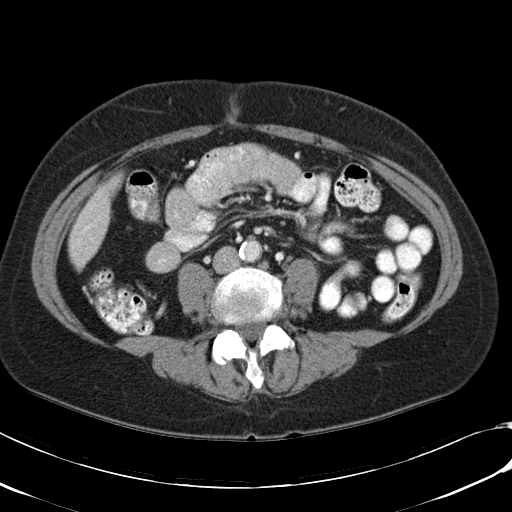
[im 47/85  soft-tissue]
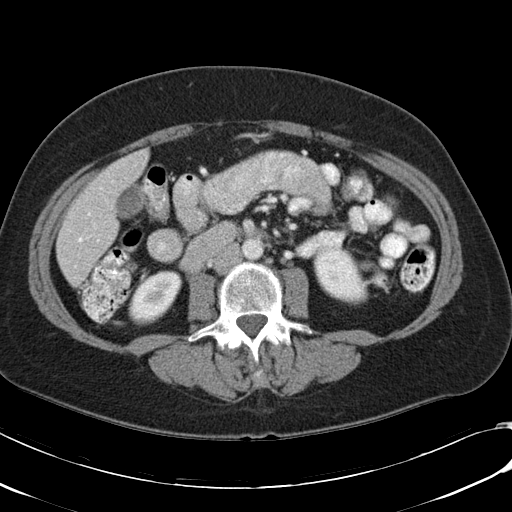
[im 57/85  soft-tissue]
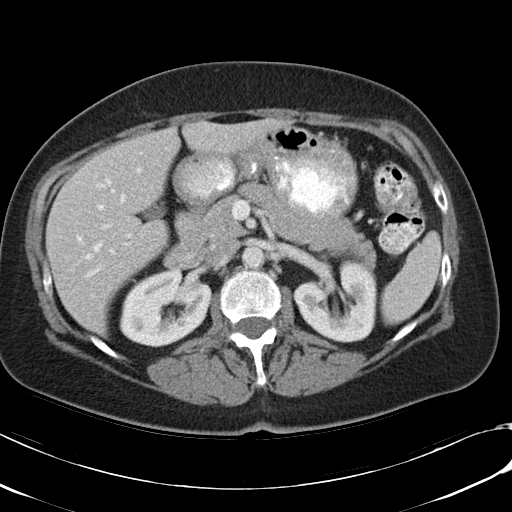
[im 57/85  bone]
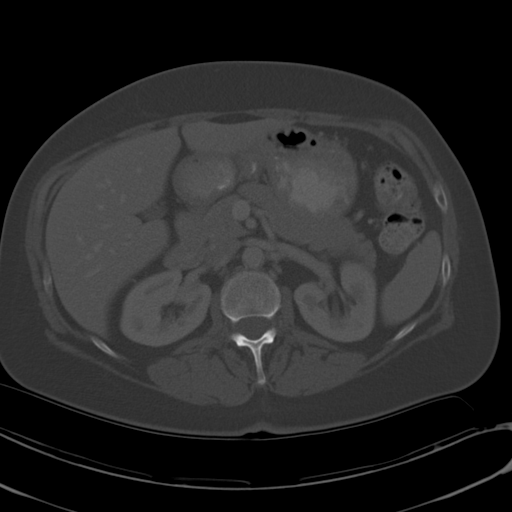
[im 61/85  soft-tissue]
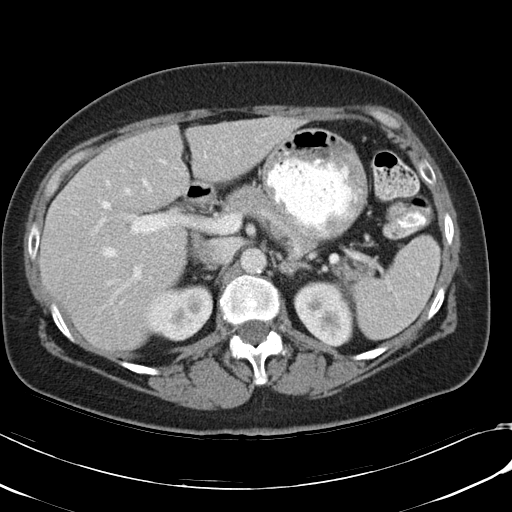
[im 66/85  soft-tissue]
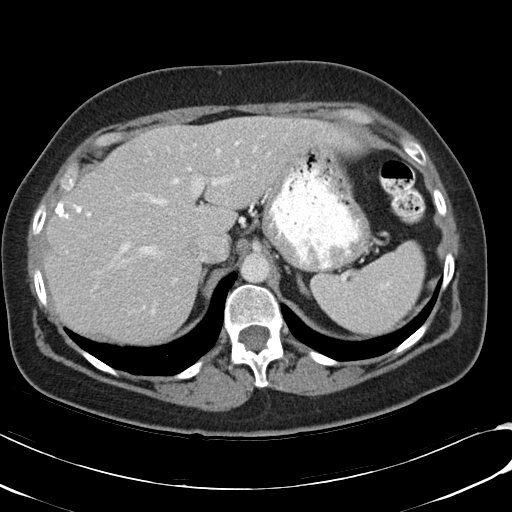
[im 75/85  soft-tissue]
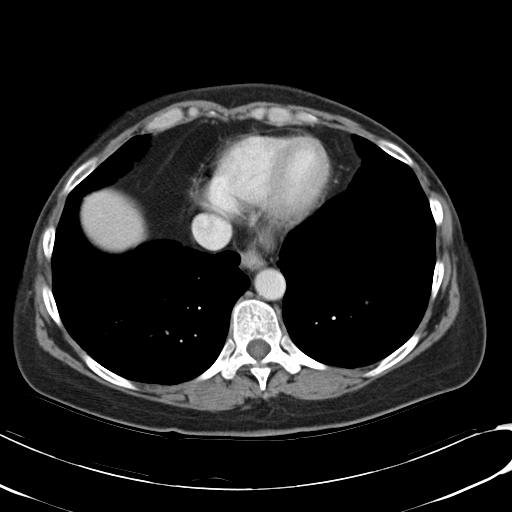
[im 80/85  soft-tissue]
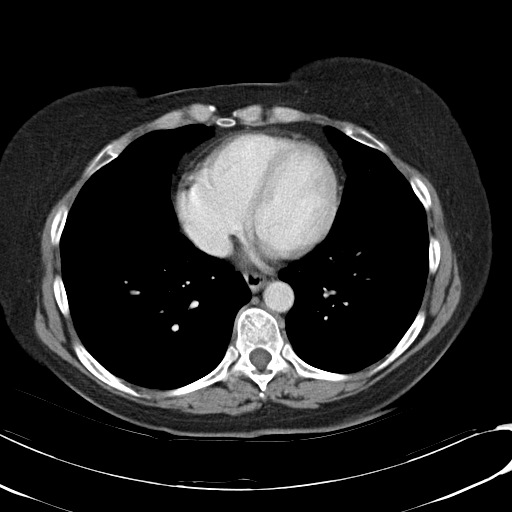

[Series 4: abd_pel_with 3.0 spo · coronal · 0.64mm/px · 3 of 74 slices shown]
[im 25/74  soft-tissue]
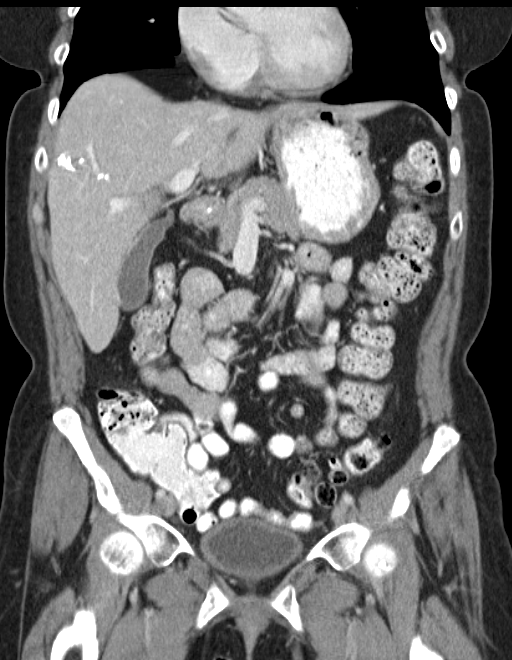
[im 33/74  soft-tissue]
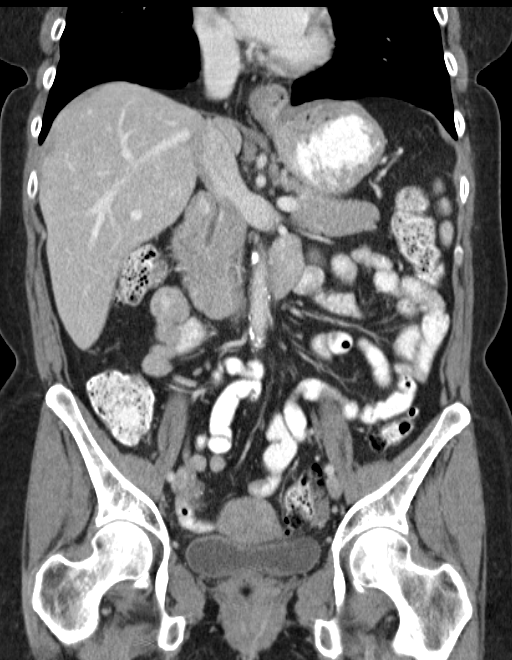
[im 41/74  soft-tissue]
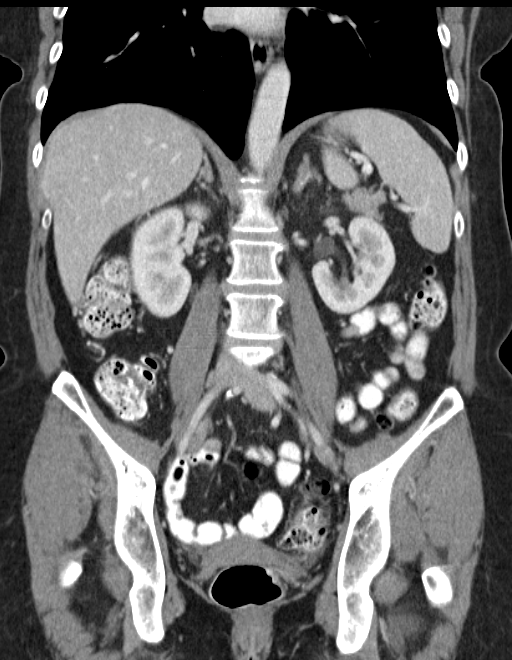

[16 of 46 positions shown; findings below may reference images not displayed]

FINDINGS: Chronic scarring at the right lung base is stable.  No
pericardial or pleural effusion.  Lumbar facet degeneration. No
acute osseous abnormality identified.

Oral contrast has reached the rectum.  No pelvic free fluid.
Uterus and adnexa are within normal limits.  Unremarkable bladder.
No distal bowel inflammatory changes although there are is a
sigmoid and distal descending colon diverticulosis.  More proximal
colon is within normal limits.  Normal terminal ileum.  No dilated
small bowel.  Chronic small hiatal hernia.  Otherwise negative
stomach.  Negative duodenum.  Chronic calcified areas in the right
lateral hepatic lobe are unchanged.  Liver enhancement is within
normal limits.  Negative gallbladder, spleen, pancreas, adrenal
glands, and portal venous system.  Major arterial structures are
patent.  There is atherosclerosis of the aorta and iliac arteries.
Kidneys are within normal limits.  No abdominal free fluid or
lymphadenopathy.
IMPRESSION: No acute or inflammatory findings in the abdomen or pelvis.
Chronic sigmoid diverticulosis. Chronic benign appearing hepatic
calcifications.

## 2013-11-06 ENCOUNTER — Other Ambulatory Visit (HOSPITAL_COMMUNITY): Payer: Self-pay | Admitting: Internal Medicine

## 2013-11-06 DIAGNOSIS — R413 Other amnesia: Secondary | ICD-10-CM

## 2013-11-06 DIAGNOSIS — R51 Headache: Secondary | ICD-10-CM

## 2013-11-06 DIAGNOSIS — H539 Unspecified visual disturbance: Secondary | ICD-10-CM

## 2013-11-08 ENCOUNTER — Ambulatory Visit (HOSPITAL_COMMUNITY)
Admission: RE | Admit: 2013-11-08 | Discharge: 2013-11-08 | Disposition: A | Discharge status: Home / Self Care | Payer: BC Managed Care – PPO | Source: Ambulatory Visit | Attending: Internal Medicine | Admitting: Internal Medicine

## 2013-11-08 ENCOUNTER — Ambulatory Visit (HOSPITAL_COMMUNITY): Payer: BC Managed Care – PPO

## 2013-11-08 DIAGNOSIS — H539 Unspecified visual disturbance: Secondary | ICD-10-CM | POA: Insufficient documentation

## 2013-11-08 DIAGNOSIS — R51 Headache: Secondary | ICD-10-CM | POA: Insufficient documentation

## 2013-11-08 DIAGNOSIS — R413 Other amnesia: Secondary | ICD-10-CM | POA: Insufficient documentation

## 2013-11-12 ENCOUNTER — Encounter: Payer: Self-pay | Admitting: Neurology

## 2013-11-12 DIAGNOSIS — E785 Hyperlipidemia, unspecified: Secondary | ICD-10-CM

## 2013-11-13 ENCOUNTER — Encounter: Payer: Self-pay | Admitting: Neurology

## 2013-11-13 ENCOUNTER — Encounter (INDEPENDENT_AMBULATORY_CARE_PROVIDER_SITE_OTHER): Payer: Self-pay

## 2013-11-13 ENCOUNTER — Ambulatory Visit (INDEPENDENT_AMBULATORY_CARE_PROVIDER_SITE_OTHER): Payer: BC Managed Care – PPO | Admitting: Neurology

## 2013-11-13 VITALS — BP 104/74 | HR 77 | Ht 62.0 in | Wt 137.0 lb

## 2013-11-13 DIAGNOSIS — R413 Other amnesia: Secondary | ICD-10-CM

## 2013-11-14 DIAGNOSIS — R413 Other amnesia: Secondary | ICD-10-CM | POA: Insufficient documentation

## 2013-11-14 NOTE — Progress Notes (Signed)
PATIENT: Sylvia [name] DOB: 1956-07-17  HISTORICAL  Star is a pleasant 58 years right-handed Caucasian female, accompanied by her husband of 108  years, for evaluation of gradual onset memory trouble, she is referred by her primary care physician Dr. Redmond School  She had  past medical history of hyperlipidemia, but not on any medication treatment.  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  In 2014, because the change of her company, she was able to settled at her house at [place] without travelling she was able to eat healthy, sleeping more regularly, she still has more trouble struggling with  words, difficulty with numbners, she can no longer multitasking,  She has no personality change, is cheerful, talkative most of the time, no gait difficulty, no visual change,  She also described two-month history of occasionally seeing traveling light rod in her right visual field, lasting for a few seconds, with no associated headaches  She had a CAT scan of the brain in February 2015, we have reviewed the films together that was essentially normal, with exception of slight bilateral frontal atrophy vs. normal variant,  Previously she had MRI of the brain in 2009, to  evaluate her headache, word finding difficulty that was essentially normal.  She had laboratory evaluation recently, was reported normal, but I do not have the report.   Initially when she first got off the road, she was able to sleep very well, recently she finds difficulty sleeping,   despite trying to keep her regular daily routine  REVIEW OF SYSTEMS: Full 14 system review of systems performed and notable only for headaches, neck pain, light sensitivity, bruise easily, insomnia  ALLERGIES: No Known Allergies  HOME MEDICATIONS: Current Outpatient Prescriptions on File Prior to Visit  Medication Sig Dispense Refill  . esomeprazole (NEXIUM) 40 MG capsule Take 1 capsule (40 mg total) by mouth daily before breakfast.  90 capsule  3  . [DISCONTINUED] pantoprazole (PROTONIX) 40 MG tablet Take 1 tablet (40 mg total) by mouth daily before breakfast.  30 tablet  11   No current facility-administered medications on file prior to visit.    PAST MEDICAL HISTORY: Past Medical History  Diagnosis Date  . Mental disorder   . Depression   . GERD (gastroesophageal reflux disease)   . Hiatal hernia   . Schatzki's ring   . Diverticulosis   . Hyperlipemia   . Memory loss   . Hyperlipemia     PAST SURGICAL HISTORY: Past Surgical History  Procedure Laterality Date  . Appendectomy  1973  . Patella fracture surgery    . Tubal ligation    . Colonoscopy  09/2011    colonic diverticulosis. Normal TI to 10cm. Next  TCS 09/2021  . Colonoscopy  10/27/2011    Procedure: COLONOSCOPY;  Surgeon: Daneil Dolin, MD;  [facility];  Service: Endoscopy;  Laterality: N/A;  12:15 PM  . Esophagogastroduodenoscopy  11/18/2011    erosive reflux esophagitis-noncritical schatzki ring, hiatal hernia  . Esophagogastroduodenoscopy  11/18/2011    Procedure: ESOPHAGOGASTRODUODENOSCOPY (EGD);  Surgeon: Daneil Dolin, MD;  [facility];  Service: Endoscopy;  Laterality: N/A;  8:30    FAMILY  HISTORY: Family History  Problem Relation Age of Onset  . Colon cancer Neg Hx     SOCIAL HISTORY:  History   Social History  . Marital Status: Married    Spouse Name: Charlie    Number of Children: 5  . Years of Education: 12   Occupational History  . Truck Geophysicist/field seismologist   .     Social History Main Topics  . Smoking status: Former Smoker -- 0.25 packs/day for 15 years    Types: Cigarettes    Quit date: 06/13/2013  . Smokeless tobacco: Never Used     Comment: 7-8 cigaretts  . Alcohol Use: 1.2 oz/week    2 Shots of liquor per week     Comment: Infrequent.  . Drug Use: No  . Sexual Activity: Not on file   Other Topics Concern  . Not on file   Social History Narrative   Patient is married Market researcher) and lives at home with her husband.   Patient has three children.   Patient has a high school education.   Patient is right-handed.   Patient drinks two cups of coffee every morning and very little tea.     PHYSICAL EXAM   Filed Vitals:   11/13/13 1023  BP: 104/74  Pulse: 77  Height: 5\' 2"  (1.575 m)  Weight: 137 lb (62.143 kg)    Not recorded    Body mass index is 25.05 kg/(m^2).   Generalized: In no acute distress  Neck: Supple, no carotid bruits   Cardiac: Regular rate rhythm  Pulmonary: Clear to auscultation bilaterally  Musculoskeletal: No deformity  Neurological examination  Mentation: Alert oriented to time, place, history taking, and causual conversation  Cranial nerve II-XII: Pupils were equal round reactive to light. Extraocular movements were full.  Visual field were full on confrontational test. Bilateral fundi were sharp.  Facial sensation and strength were normal. Hearing was intact to finger rubbing bilaterally. Uvula tongue midline.  Head turning and shoulder shrug and were normal and symmetric.Tongue protrusion into cheek strength was normal.  Motor: Normal tone, bulk and strength.  Sensory: Intact to fine touch, pinprick, preserved  vibratory sensation, and proprioception at toes.  Coordination: Normal finger to nose, heel-to-shin bilaterally there was no truncal ataxia  Gait: Rising up from seated position without assistance, normal stance, without trunk ataxia, moderate stride, good arm swing, smooth turning, able to perform tiptoe, and heel walking without difficulty.   Romberg signs: Negative  Deep tendon reflexes: Brachioradialis 2/2, biceps 2/2, triceps 2/2, patellar 2/2, Achilles 2/2, plantar responses were flexor bilaterally.   DIAGNOSTIC DATA (LABS, IMAGING, TESTING) - I reviewed patient records, labs, notes, testing and imaging myself where available.  ASSESSMENT AND PLAN  ALOIS MINCER is a 58 y.o. female complains of  10 years history of gradual onset memory trouble, Mini-Mental Status Examination is 47 out of 30 today,  1.   mild cognitive impairment, differential diagnosis including age-related changes, vs.  Early stage of central nervous system degenerative disease 2.  MRI  of brain 3.  laboratory evaluations she will bring her recent lab work from her primary care physician, depend on was done in the past, will order what is necessary after reviewing the result. 4. EEG, neuropsychology evaluations  5. RTC in 2 months         Marcial Pacas, M.D. Ph.D.  [facility] [address], [place], [place] [address] [phone]

## 2013-11-22 ENCOUNTER — Other Ambulatory Visit: Payer: BC Managed Care – PPO

## 2013-11-23 ENCOUNTER — Other Ambulatory Visit: Payer: BC Managed Care – PPO | Admitting: Radiology

## 2013-11-29 ENCOUNTER — Ambulatory Visit (INDEPENDENT_AMBULATORY_CARE_PROVIDER_SITE_OTHER): Payer: BC Managed Care – PPO

## 2013-11-29 ENCOUNTER — Ambulatory Visit (INDEPENDENT_AMBULATORY_CARE_PROVIDER_SITE_OTHER): Payer: BC Managed Care – PPO | Admitting: Radiology

## 2013-11-29 ENCOUNTER — Telehealth: Payer: Self-pay | Admitting: Neurology

## 2013-11-29 ENCOUNTER — Other Ambulatory Visit (INDEPENDENT_AMBULATORY_CARE_PROVIDER_SITE_OTHER): Payer: BC Managed Care – PPO

## 2013-11-29 DIAGNOSIS — R413 Other amnesia: Secondary | ICD-10-CM

## 2013-11-29 DIAGNOSIS — Z0289 Encounter for other administrative examinations: Secondary | ICD-10-CM

## 2013-11-29 NOTE — Telephone Encounter (Signed)
Lab reviewed, dated 06/2013 from [facility], normal CMP, CBC, chole 171, LDL 93, TSH.  She is here for evaluation of memory trouble, we will proceed with more laboratory evaluation, order was placed,

## 2013-12-03 ENCOUNTER — Telehealth: Payer: Self-pay | Admitting: Neurology

## 2013-12-03 LAB — IFE AND PE, SERUM
ALBUMIN SERPL ELPH-MCNC: 3.9 g/dL (ref 3.2–5.6)
ALBUMIN/GLOB SERPL: 1.5 (ref 0.7–2.0)
ALPHA2 GLOB SERPL ELPH-MCNC: 0.6 g/dL (ref 0.4–1.2)
Alpha 1: 0.2 g/dL (ref 0.1–0.4)
B-Globulin SerPl Elph-Mcnc: 1 g/dL (ref 0.6–1.3)
Gamma Glob SerPl Elph-Mcnc: 1 g/dL (ref 0.5–1.6)
Globulin, Total: 2.7 g/dL (ref 2.0–4.5)
IGG (IMMUNOGLOBIN G), SERUM: 992 mg/dL (ref 700–1600)
IgA/Immunoglobulin A, Serum: 182 mg/dL (ref 91–414)
IgM (Immunoglobulin M), Srm: 148 mg/dL (ref 40–230)
TOTAL PROTEIN: 6.6 g/dL (ref 6.0–8.5)

## 2013-12-03 LAB — FOLATE: Folate: 17.3 ng/mL (ref >3.0)

## 2013-12-03 LAB — SEDIMENTATION RATE: Sed Rate: 10 mm/hr (ref 0–40)

## 2013-12-03 LAB — C-REACTIVE PROTEIN: CRP: 3.6 mg/L (ref 0.0–4.9)

## 2013-12-03 LAB — VITAMIN B12: VITAMIN B 12: 1207 pg/mL — AB (ref 211–946)

## 2013-12-03 LAB — ANA W/REFLEX IF POSITIVE: Anti Nuclear Antibody(ANA): NEGATIVE

## 2013-12-03 LAB — RPR: SYPHILIS RPR SCR: NONREACTIVE

## 2013-12-03 NOTE — Telephone Encounter (Signed)
Patient was given her lab results.  Patient verbalized understanding.

## 2013-12-03 NOTE — Telephone Encounter (Signed)
Pt returned call from office and asked for the results of the labs that were done last week.  Please reference message from 11/29/13.  Thank you

## 2013-12-04 NOTE — Progress Notes (Signed)
Quick Note:  Shared normal labs with patient ,verbalized understanding ______

## 2013-12-07 NOTE — Procedures (Signed)
   HISTORY: 58 years old female, presenting with 2 years history of gradual onset memory trouble  TECHNIQUE:  16 channel EEG was performed based on standard 10-16 international system. One channel was dedicated to EKG, which has demonstrates normal sinus rhythm of 66 beats per minutes.  Upon awakening, the posterior background activity was well-developed, in alpha range, 10 Hz, with amplitude of 35 microvoltage, reactive to eye opening and closure.  There was no evidence of epilepsy form discharge.  Photic stimulation was performed, which induced a symmetric photic driving.  Hyperventilation was performed, there was no abnormality elicit.  Stage II sleep sleep was achieved, there was no abnormality noted.  CONCLUSION: This is a  normal awake and asleep EEG.  There is no electrodiagnostic evidence of epileptiform discharge

## 2014-01-11 ENCOUNTER — Ambulatory Visit: Payer: BC Managed Care – PPO | Admitting: Neurology

## 2014-02-04 DIAGNOSIS — R413 Other amnesia: Secondary | ICD-10-CM

## 2014-02-13 DIAGNOSIS — R413 Other amnesia: Secondary | ICD-10-CM

## 2014-02-25 ENCOUNTER — Telehealth: Payer: Self-pay | Admitting: Neurology

## 2014-02-25 NOTE — Telephone Encounter (Signed)
I called back and spoke with the patient.  She said Dr Valentina Shaggy diagnosed her with ADD and since she is not following up with him, he recommended she have either [place] or her PCP prescribe meds to treat the condition.  The patient says she does not wish to contact her PCP regarding this and would prefer Dr Krista Blue prescribe something for her.   Please advise.  Thank you.

## 2014-02-25 NOTE — Telephone Encounter (Signed)
I called back.  Got no answer.  Left message.  

## 2014-02-25 NOTE — Telephone Encounter (Signed)
Patient newly diagnosed with ADD by Dr Valentina Shaggy.  He informed her that Dr. Krista Blue could provide Rx for medication.  Please call and advise.

## 2014-02-25 NOTE — Telephone Encounter (Signed)
Jessica:  Please let patient know, our office usually do not manage  ADD, she should go back to her primary care, or a psychiatrist for medication management

## 2014-05-06 ENCOUNTER — Other Ambulatory Visit (HOSPITAL_COMMUNITY): Payer: Self-pay | Admitting: Internal Medicine

## 2014-05-06 ENCOUNTER — Encounter: Payer: Self-pay | Admitting: Neurology

## 2014-05-06 ENCOUNTER — Ambulatory Visit (INDEPENDENT_AMBULATORY_CARE_PROVIDER_SITE_OTHER): Payer: BC Managed Care – PPO | Admitting: Neurology

## 2014-05-06 VITALS — BP 114/77 | HR 72 | Wt 121.0 lb

## 2014-05-06 DIAGNOSIS — R413 Other amnesia: Secondary | ICD-10-CM

## 2014-05-06 DIAGNOSIS — K219 Gastro-esophageal reflux disease without esophagitis: Secondary | ICD-10-CM

## 2014-05-06 DIAGNOSIS — E785 Hyperlipidemia, unspecified: Secondary | ICD-10-CM

## 2014-05-06 DIAGNOSIS — M259 Joint disorder, unspecified: Secondary | ICD-10-CM

## 2014-05-06 NOTE — Progress Notes (Addendum)
PATIENT: Sylvia [name] DOB: 1955/12/25  HISTORICAL  [place] is a pleasant 67 years right-handed Caucasian female, accompanied by her husband of 66  years, for evaluation of gradual onset memory trouble, she is referred by her primary care physician Dr. Redmond School  She had  past medical history of hyperlipidemia, but not on any medication treatment.  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  In 2014, because the change of her company, she was able to settled at her house at [place] without travelling,  she was able to eat healthy, sleeping more regularly, she still has more trouble struggling with  words, difficulty with numbners, she can no longer multitasking,  She has no personality change, is cheerful, talkative most of the time, no gait difficulty, no visual change,  She also described two-month history of occasionally seeing traveling light rod in her right visual field, lasting for a few seconds, with no associated headaches  She had a CAT scan of the brain in February 2015, we have reviewed the films together that was essentially normal, with exception of slight bilateral frontal atrophy vs. normal variant,  Previously she had MRI of the brain in 2009, to  evaluate her headache, word finding difficulty that was essentially normal.  She had laboratory evaluation recently, was reported normal, but I do not have the report.   Initially when she first got off the road, she was able to sleep very well, recently she finds difficulty sleeping,   despite trying to keep her regular daily routine  UPDATE August 10th 2015: She was evaluated by Dr. Lake Bells the interval, patient was noticeable mild fidgetiness, propensity to rule out easily, instance of in tentativeness during conversation, circumferential speaking style, her self report, suggest a high likelihood of ADHD, there was no other cause of her subjective cognitive difficulty found.  She was started on Adderall 10 mg twice a day but his primary care physician Dr. Gerarda Fraction for 2 months since June 2015, still on titrating up dose slowly, it has helped her at least 50%.  She denies significant side effects, she was able to focus on her task much better   REVIEW OF SYSTEMS: Full 14 system review of systems performed and notable only for memory loss, confusion, decreased concentration  ALLERGIES: No Known Allergies  HOME MEDICATIONS: Current Outpatient Prescriptions on File Prior to Visit  Medication Sig Dispense Refill  . Melatonin 5 MG CAPS Take 2 capsules by mouth 3 times/day as needed-between meals & bedtime.       . [DISCONTINUED] pantoprazole (PROTONIX) 40 MG tablet Take 1 tablet (40 mg total) by mouth daily before breakfast.  30 tablet  11   No current facility-administered medications on file  prior to visit.    PAST MEDICAL HISTORY: Past Medical History  Diagnosis Date  . Mental disorder   . Depression   . GERD (gastroesophageal reflux disease)   . Hiatal hernia   . Schatzki's ring   . Diverticulosis   . Hyperlipemia   . Memory loss   . Hyperlipemia     PAST SURGICAL HISTORY: Past Surgical History  Procedure Laterality Date  . Appendectomy  1973  . Patella fracture  surgery    . Tubal ligation    . Colonoscopy  09/2011    colonic diverticulosis. Normal TI to 10cm. Next TCS 09/2021  . Colonoscopy  10/27/2011    Procedure: COLONOSCOPY;  Surgeon: Daneil Dolin, MD;  [facility];  Service: Endoscopy;  Laterality: N/A;  12:15 PM  . Esophagogastroduodenoscopy  11/18/2011    erosive reflux esophagitis-noncritical schatzki ring, hiatal hernia  . Esophagogastroduodenoscopy  11/18/2011    Procedure: ESOPHAGOGASTRODUODENOSCOPY (EGD);  Surgeon: Daneil Dolin, MD;  [facility];  Service: Endoscopy;  Laterality: N/A;  8:30    FAMILY HISTORY: Family History  Problem Relation Age of Onset  . Colon cancer Neg Hx   . Heart attack Father   . Hyperlipidemia Father     SOCIAL HISTORY:  History   Social History  . Marital Status: Married    Spouse Name: Charlie    Number of Children: 5  . Years of Education: 12   Occupational History  . Truck Geophysicist/field seismologist   .     Social History Main Topics  . Smoking status: Former Smoker -- 0.25 packs/day for 15 years    Types: Cigarettes    Quit date: 06/13/2013  . Smokeless tobacco: Never Used     Comment: 7-8 cigaretts  . Alcohol Use: 1.2 oz/week    2 Shots of liquor per week     Comment: Infrequent.  . Drug Use: No  . Sexual Activity: Not on file   Other Topics Concern  . Not on file   Social History Narrative   Patient is married Market researcher) and lives at home with her husband.   Patient has three children.   Patient has a high school education.   Patient is right-handed.   Patient drinks two cups of coffee every morning and very little tea.     PHYSICAL EXAM   Filed Vitals:   05/06/14 1129  BP: 114/77  Pulse: 72  Weight: 121 lb (54.885 kg)    Not recorded    Body mass index is 22.13 kg/(m^2).   Generalized: In no acute distress  Neck: Supple, no carotid bruits   Cardiac: Regular rate rhythm  Pulmonary: Clear to auscultation bilaterally  Musculoskeletal: No  deformity  Neurological examination  Mentation: Alert oriented to time, place, history taking, and causual conversation,  Cranial nerve II-XII: Pupils were equal round reactive to light. Extraocular movements were full.  Visual field were full on confrontational test. Bilateral fundi were sharp.  Facial sensation and strength were normal. Hearing was intact to finger rubbing bilaterally. Uvula tongue midline.  Head turning and shoulder shrug and were normal and symmetric.Tongue protrusion into cheek strength was normal.  Motor: Normal tone, bulk and strength.  Sensory: Intact to fine touch, pinprick, preserved vibratory sensation, and proprioception at toes.  Coordination: Normal finger to nose, heel-to-shin bilaterally there was no truncal ataxia  Gait: Rising up from seated position without assistance, normal stance, without trunk ataxia, moderate stride, good arm swing, smooth turning, able to  perform tiptoe, and heel walking without difficulty.   Romberg signs: Negative  Deep tendon reflexes: Brachioradialis 2/2, biceps 2/2, triceps 2/2, patellar 2/2, Achilles 2/2, plantar responses were flexor bilaterally.   DIAGNOSTIC DATA (LABS, IMAGING, TESTING) - I reviewed patient records, labs, notes, testing and imaging myself where available.  ASSESSMENT AND PLAN  Sylvia [name] is a 58 y.o. female complains of  10 years history of gradual onset memory trouble, difficulty focusing on tasks, recent neuropsychiatric evaluation consistent with ADD, her symptoms did improve with titrating dose of Adderall, which is managed by her primary care physician Dr. Gerarda Fraction,   She is to continue Adderall treatment, Only return to clinic for new issues     Marcial Pacas, M.D. Ph.D.  [facility] [address] [place],  [address] [phone]

## 2014-05-10 ENCOUNTER — Ambulatory Visit (HOSPITAL_COMMUNITY)
Admission: RE | Admit: 2014-05-10 | Discharge: 2014-05-10 | Disposition: A | Discharge status: Home / Self Care | Payer: BC Managed Care – PPO | Source: Ambulatory Visit | Attending: Internal Medicine | Admitting: Internal Medicine

## 2014-05-10 DIAGNOSIS — M7120 Synovial cyst of popliteal space [Baker], unspecified knee: Secondary | ICD-10-CM | POA: Diagnosis not present

## 2014-05-10 DIAGNOSIS — M259 Joint disorder, unspecified: Secondary | ICD-10-CM | POA: Insufficient documentation

## 2014-05-10 DIAGNOSIS — IMO0002 Reserved for concepts with insufficient information to code with codable children: Secondary | ICD-10-CM | POA: Insufficient documentation

## 2014-05-10 DIAGNOSIS — X58XXXA Exposure to other specified factors, initial encounter: Secondary | ICD-10-CM | POA: Insufficient documentation

## 2014-08-27 ENCOUNTER — Other Ambulatory Visit (HOSPITAL_COMMUNITY): Payer: Self-pay | Admitting: Internal Medicine

## 2014-08-27 DIAGNOSIS — Z1231 Encounter for screening mammogram for malignant neoplasm of breast: Secondary | ICD-10-CM

## 2014-09-04 ENCOUNTER — Ambulatory Visit (HOSPITAL_COMMUNITY)
Admission: RE | Admit: 2014-09-04 | Discharge: 2014-09-04 | Disposition: A | Discharge status: Home / Self Care | Payer: BC Managed Care – PPO | Source: Ambulatory Visit | Attending: Internal Medicine | Admitting: Internal Medicine

## 2014-09-04 DIAGNOSIS — Z1231 Encounter for screening mammogram for malignant neoplasm of breast: Secondary | ICD-10-CM | POA: Insufficient documentation

## 2015-04-18 ENCOUNTER — Other Ambulatory Visit (HOSPITAL_COMMUNITY): Payer: Self-pay | Admitting: Internal Medicine

## 2015-04-18 DIAGNOSIS — M5410 Radiculopathy, site unspecified: Secondary | ICD-10-CM

## 2015-04-18 DIAGNOSIS — M5126 Other intervertebral disc displacement, lumbar region: Secondary | ICD-10-CM

## 2015-04-22 ENCOUNTER — Ambulatory Visit (HOSPITAL_COMMUNITY)
Admission: RE | Admit: 2015-04-22 | Discharge: 2015-04-22 | Disposition: A | Discharge status: Home / Self Care | Payer: 59 | Source: Ambulatory Visit | Attending: Internal Medicine | Admitting: Internal Medicine

## 2015-04-22 DIAGNOSIS — M545 Low back pain: Secondary | ICD-10-CM | POA: Diagnosis present

## 2015-04-22 DIAGNOSIS — M4316 Spondylolisthesis, lumbar region: Secondary | ICD-10-CM | POA: Insufficient documentation

## 2015-04-22 DIAGNOSIS — M4686 Other specified inflammatory spondylopathies, lumbar region: Secondary | ICD-10-CM | POA: Insufficient documentation

## 2015-04-22 DIAGNOSIS — M5126 Other intervertebral disc displacement, lumbar region: Secondary | ICD-10-CM | POA: Insufficient documentation

## 2015-04-22 DIAGNOSIS — M79604 Pain in right leg: Secondary | ICD-10-CM | POA: Diagnosis not present

## 2015-04-22 DIAGNOSIS — M5410 Radiculopathy, site unspecified: Secondary | ICD-10-CM

## 2015-12-05 ENCOUNTER — Other Ambulatory Visit (HOSPITAL_COMMUNITY): Payer: Self-pay | Admitting: Internal Medicine

## 2015-12-05 DIAGNOSIS — Z1231 Encounter for screening mammogram for malignant neoplasm of breast: Secondary | ICD-10-CM

## 2015-12-08 ENCOUNTER — Ambulatory Visit (HOSPITAL_COMMUNITY)
Admission: RE | Admit: 2015-12-08 | Discharge: 2015-12-08 | Disposition: A | Discharge status: Home / Self Care | Payer: BLUE CROSS/BLUE SHIELD | Source: Ambulatory Visit | Attending: Internal Medicine | Admitting: Internal Medicine

## 2015-12-08 DIAGNOSIS — Z1231 Encounter for screening mammogram for malignant neoplasm of breast: Secondary | ICD-10-CM | POA: Diagnosis not present

## 2016-01-19 DIAGNOSIS — M5410 Radiculopathy, site unspecified: Secondary | ICD-10-CM | POA: Diagnosis not present

## 2016-01-19 DIAGNOSIS — Z6821 Body mass index (BMI) 21.0-21.9, adult: Secondary | ICD-10-CM | POA: Diagnosis not present

## 2016-01-19 DIAGNOSIS — M5136 Other intervertebral disc degeneration, lumbar region: Secondary | ICD-10-CM | POA: Diagnosis not present

## 2016-01-19 DIAGNOSIS — G8929 Other chronic pain: Secondary | ICD-10-CM | POA: Diagnosis not present

## 2016-01-19 DIAGNOSIS — Z1389 Encounter for screening for other disorder: Secondary | ICD-10-CM | POA: Diagnosis not present

## 2016-01-19 DIAGNOSIS — G5703 Lesion of sciatic nerve, bilateral lower limbs: Secondary | ICD-10-CM | POA: Diagnosis not present

## 2016-04-22 DIAGNOSIS — Z1389 Encounter for screening for other disorder: Secondary | ICD-10-CM | POA: Diagnosis not present

## 2016-04-22 DIAGNOSIS — Z6820 Body mass index (BMI) 20.0-20.9, adult: Secondary | ICD-10-CM | POA: Diagnosis not present

## 2016-04-22 DIAGNOSIS — F909 Attention-deficit hyperactivity disorder, unspecified type: Secondary | ICD-10-CM | POA: Diagnosis not present

## 2016-07-06 DIAGNOSIS — Z23 Encounter for immunization: Secondary | ICD-10-CM | POA: Diagnosis not present

## 2016-07-22 ENCOUNTER — Emergency Department (HOSPITAL_COMMUNITY)
Admission: EM | Admit: 2016-07-22 | Discharge: 2016-07-23 | Disposition: A | Discharge status: Home / Self Care | Payer: BLUE CROSS/BLUE SHIELD | Attending: Emergency Medicine | Admitting: Emergency Medicine

## 2016-07-22 ENCOUNTER — Encounter (HOSPITAL_COMMUNITY): Payer: Self-pay | Admitting: Emergency Medicine

## 2016-07-22 ENCOUNTER — Emergency Department (HOSPITAL_COMMUNITY): Payer: BLUE CROSS/BLUE SHIELD

## 2016-07-22 DIAGNOSIS — R1032 Left lower quadrant pain: Secondary | ICD-10-CM

## 2016-07-22 DIAGNOSIS — Z79899 Other long term (current) drug therapy: Secondary | ICD-10-CM | POA: Diagnosis not present

## 2016-07-22 DIAGNOSIS — K5732 Diverticulitis of large intestine without perforation or abscess without bleeding: Secondary | ICD-10-CM | POA: Diagnosis not present

## 2016-07-22 DIAGNOSIS — Z87891 Personal history of nicotine dependence: Secondary | ICD-10-CM | POA: Insufficient documentation

## 2016-07-22 DIAGNOSIS — R1030 Lower abdominal pain, unspecified: Secondary | ICD-10-CM | POA: Diagnosis not present

## 2016-07-22 LAB — COMPREHENSIVE METABOLIC PANEL
ALT: 15 U/L (ref 14–54)
ANION GAP: 5 (ref 5–15)
AST: 17 U/L (ref 15–41)
Albumin: 3.9 g/dL (ref 3.5–5.0)
Alkaline Phosphatase: 59 U/L (ref 38–126)
BUN: 11 mg/dL (ref 6–20)
CHLORIDE: 101 mmol/L (ref 101–111)
CO2: 26 mmol/L (ref 22–32)
Calcium: 8.8 mg/dL — ABNORMAL LOW (ref 8.9–10.3)
Creatinine, Ser: 0.59 mg/dL (ref 0.44–1.00)
GFR calc non Af Amer: >60 mL/min (ref >60)
Glucose, Bld: 108 mg/dL — ABNORMAL HIGH (ref 65–99)
Potassium: 3.4 mmol/L — ABNORMAL LOW (ref 3.5–5.1)
SODIUM: 132 mmol/L — AB (ref 135–145)
Total Bilirubin: 1.1 mg/dL (ref 0.3–1.2)
Total Protein: 7.1 g/dL (ref 6.5–8.1)

## 2016-07-22 LAB — URINALYSIS, ROUTINE W REFLEX MICROSCOPIC
Bilirubin Urine: NEGATIVE
GLUCOSE, UA: NEGATIVE mg/dL
Ketones, ur: 15 mg/dL — AB
Nitrite: NEGATIVE
Protein, ur: NEGATIVE mg/dL
Specific Gravity, Urine: 1.01 (ref 1.005–1.030)
pH: 6.5 (ref 5.0–8.0)

## 2016-07-22 LAB — CBC
HCT: 45.1 % (ref 36.0–46.0)
HEMOGLOBIN: 15.4 g/dL — AB (ref 12.0–15.0)
MCH: 32.4 pg (ref 26.0–34.0)
MCHC: 34.1 g/dL (ref 30.0–36.0)
MCV: 94.9 fL (ref 78.0–100.0)
Platelets: 251 10*3/uL (ref 150–400)
RBC: 4.75 MIL/uL (ref 3.87–5.11)
RDW: 13.1 % (ref 11.5–15.5)
WBC: 13.3 10*3/uL — ABNORMAL HIGH (ref 4.0–10.5)

## 2016-07-22 LAB — LIPASE, BLOOD: Lipase: 44 U/L (ref 11–51)

## 2016-07-22 LAB — URINE MICROSCOPIC-ADD ON

## 2016-07-22 MED ORDER — ONDANSETRON HCL 4 MG/2ML IJ SOLN
4.0000 mg | Freq: Once | INTRAMUSCULAR | Status: AC
  Administered 2016-07-22: 4 mg via INTRAVENOUS
  Filled 2016-07-22: qty 2

## 2016-07-22 MED ORDER — CIPROFLOXACIN IN D5W 400 MG/200ML IV SOLN
400.0000 mg | Freq: Once | INTRAVENOUS | Status: AC
  Administered 2016-07-22: 400 mg via INTRAVENOUS
  Filled 2016-07-22: qty 200

## 2016-07-22 MED ORDER — HYDROMORPHONE HCL 1 MG/ML IJ SOLN
1.0000 mg | Freq: Once | INTRAMUSCULAR | Status: AC
  Administered 2016-07-22: 1 mg via INTRAVENOUS
  Filled 2016-07-22: qty 1

## 2016-07-22 MED ORDER — METRONIDAZOLE 500 MG PO TABS: 500.0000 mg | ORAL_TABLET | Freq: Two times a day (BID) | ORAL | 0 refills | Status: AC

## 2016-07-22 MED ORDER — CIPROFLOXACIN HCL 500 MG PO TABS: 500.0000 mg | ORAL_TABLET | Freq: Two times a day (BID) | ORAL | 0 refills | Status: AC

## 2016-07-22 MED ORDER — SODIUM CHLORIDE 0.9 % IV BOLUS (SEPSIS)
1000.0000 mL | Freq: Once | INTRAVENOUS | Status: AC
  Administered 2016-07-22: 1000 mL via INTRAVENOUS

## 2016-07-22 MED ORDER — HYDROCODONE-ACETAMINOPHEN 5-325 MG PO TABS: 1.0000 | ORAL_TABLET | ORAL | 0 refills | Status: AC | PRN

## 2016-07-22 MED ORDER — METRONIDAZOLE IN NACL 5-0.79 MG/ML-% IV SOLN
500.0000 mg | Freq: Once | INTRAVENOUS | Status: AC
  Administered 2016-07-22: 500 mg via INTRAVENOUS
  Filled 2016-07-22: qty 100

## 2016-07-22 MED ORDER — ONDANSETRON HCL 4 MG PO TABS: 4.0000 mg | ORAL_TABLET | Freq: Four times a day (QID) | ORAL | 0 refills | Status: AC

## 2016-07-22 MED ORDER — FENTANYL CITRATE (PF) 100 MCG/2ML IJ SOLN
50.0000 ug | Freq: Once | INTRAMUSCULAR | Status: AC
  Administered 2016-07-22: 50 ug via INTRAVENOUS
  Filled 2016-07-22: qty 2

## 2016-07-22 NOTE — ED Notes (Signed)
Pt consumed water and food with no nausea

## 2016-07-22 NOTE — ED Notes (Signed)
Pt given peanut butter and crackers

## 2016-07-22 NOTE — ED Triage Notes (Signed)
Pt c/o lower abd pain that started this am. Pt c/o nausea.

## 2016-07-22 NOTE — ED Provider Notes (Signed)
[facility] DEPT Provider Note   CSN: [id number] Arrival date & time: 07/22/16  1937 By signing my name below, I, Dyke Brackett, attest that this documentation has been prepared under the direction and in the presence of Ezequiel Essex, MD . Electronically Signed: Dyke Brackett, Scribe. 07/22/2016. 8:27 PM.   History   Chief Complaint Chief Complaint  Patient presents with  . Abdominal Pain   HPI SHERIDYN CANINO is a 60 y.o. female with hx of diverticulosis, GERD, hiatal hernia and hyperlipidemia who presents to the Emergency Department complaining of sudden onset, worsening left-sided suprapubic pain which began at 3 pm this afternoon. She also notes associated nausea and decreased appetite. Pt states she woke up and "didn't feel well" in the morning, but the abdomina; pain did not start until 3 pm. Her pain began to progressivley worsen at 4 pm. Pain is exacerbated by bending. She has applied heat with no relief.  Pt states she has some lumbar back pain, which is normal for her. Pt had a normal bowel movement this morning. She denies vomiting, diarrhea, fever, dysuria, difficulty urinating, vaginal bleeding, or vaginal discharge. Abdominal SHx includes appendectomy.   The history is provided by the patient. No language interpreter was used.   Past Medical History:  Diagnosis Date  . Depression   . Diverticulosis   . GERD (gastroesophageal reflux disease)   . Hiatal hernia   . Hyperlipemia   . Hyperlipemia   . Memory loss   . Mental disorder   . Schatzki's ring     Patient Active Problem List   Diagnosis Date Noted  . Memory loss 11/14/2013  . Hyperlipemia   . RUQ pain 10/28/2011  . Epigastric pain 10/28/2011  . GERD (gastroesophageal reflux disease) 10/28/2011    Past Surgical History:  Procedure Laterality Date  . APPENDECTOMY  1973  . BACK SURGERY    . COLONOSCOPY  09/2011   colonic diverticulosis. Normal TI to 10cm. Next TCS 09/2021  . COLONOSCOPY  10/27/2011   Procedure: COLONOSCOPY;  Surgeon: Daneil Dolin, MD;  Location: [facility];  Service: Endoscopy;  Laterality: N/A;  12:15 PM  . ESOPHAGOGASTRODUODENOSCOPY  11/18/2011   erosive reflux esophagitis-noncritical schatzki ring, hiatal hernia  . ESOPHAGOGASTRODUODENOSCOPY  11/18/2011   Procedure: ESOPHAGOGASTRODUODENOSCOPY (EGD);  Surgeon: Daneil Dolin, MD;  Location: [facility];  Service: Endoscopy;  Laterality: N/A;  8:30  . PATELLA FRACTURE SURGERY    . TUBAL LIGATION      OB History    No data available      Home Medications    Prior to Admission medications   Medication Sig Start Date End Date Taking? Authorizing Provider  ALPRAZolam Duanne Moron) 0.5 MG tablet Take 1 mg by mouth 4 (four) times daily. 06/08/16  Yes Historical Provider, MD  amphetamine-dextroamphetamine (ADDERALL) 20 MG tablet Take 30 mg by mouth 2 (two) times daily. 06/25/16  Yes Historical Provider, MD    Family History Family History  Problem Relation Age of Onset  . Heart attack Father   . Hyperlipidemia Father   . Colon cancer Neg Hx     Social History Social History  Substance Use Topics  . Smoking status: Former Smoker    Packs/day: 0.25    Years: 15.00    Types: Cigarettes    Quit date: 06/13/2013  . Smokeless tobacco: Never Used     Comment: 7-8 cigaretts  . Alcohol use 1.2 oz/week    2 Shots of liquor per week  Comment: Infrequent.    Allergies   Review of patient's allergies indicates no known allergies.  Review of Systems Review of Systems 10 systems reviewed and all are negative for acute change except as noted in the HPI.   Physical Exam Updated Vital Signs BP 148/92   Pulse 82   Temp 97.6 F (36.4 C) (Oral)   Resp 20   Ht '5\' 1"'$  (1.549 m)   Wt 110 lb (49.9 kg)   SpO2 96%   BMI 20.78 kg/m   Physical Exam  Constitutional: She is oriented to person, place, and time. She appears well-developed and well-nourished. No distress.  uncomfortable  HENT:  Head: Normocephalic and  atraumatic.  Mouth/Throat: Oropharynx is clear and moist. No oropharyngeal exudate.  Eyes: Conjunctivae and EOM are normal. Pupils are equal, round, and reactive to light.  Neck: Normal range of motion. Neck supple.  No meningismus. No CVA tenderness  Cardiovascular: Normal rate, regular rhythm, normal heart sounds and intact distal pulses.   No murmur heard. Pulmonary/Chest: Effort normal and breath sounds normal. No respiratory distress.  Abdominal: Soft. There is tenderness. There is no rebound and no guarding.  Tender in LLQ and suprapubic  Musculoskeletal: Normal range of motion. She exhibits no edema or tenderness.  Neurological: She is alert and oriented to person, place, and time. No cranial nerve deficit. She exhibits normal muscle tone. Coordination normal.   5/5 strength throughout. CN 2-12 intact.Equal grip strength.   Skin: Skin is warm.  Well-healed lower lumbar incision   Psychiatric: She has a normal mood and affect. Her behavior is normal.  Nursing note and vitals reviewed.  ED Treatments / Results  DIAGNOSTIC STUDIES:  Oxygen Saturation is 100% on RA, normal by my interpretation.    COORDINATION OF CARE:  8:24 PM Discussed treatment plan with pt at bedside and pt agreed to plan.  Labs (all labs ordered are listed, but only abnormal results are displayed) Labs Reviewed  COMPREHENSIVE METABOLIC PANEL - Abnormal; Notable for the following:       Result Value   Sodium 132 (*)    Potassium 3.4 (*)    Glucose, Bld 108 (*)    Calcium 8.8 (*)    All other components within normal limits  CBC - Abnormal; Notable for the following:    WBC 13.3 (*)    Hemoglobin 15.4 (*)    All other components within normal limits  URINALYSIS, ROUTINE W REFLEX MICROSCOPIC (NOT AT [facility]) - Abnormal; Notable for the following:    Hgb urine dipstick MODERATE (*)    Ketones, ur 15 (*)    Leukocytes, UA SMALL (*)    All other components within normal limits  URINE MICROSCOPIC-ADD ON -  Abnormal; Notable for the following:    Squamous Epithelial / LPF 6-30 (*)    Bacteria, UA FEW (*)    All other components within normal limits  URINE CULTURE  LIPASE, BLOOD    EKG  EKG Interpretation None      Radiology Ct Renal Stone Study  Result Date: 07/22/2016 CLINICAL DATA:  Left suprapubic pain since this afternoon. EXAM: CT ABDOMEN AND PELVIS WITHOUT CONTRAST TECHNIQUE: Multidetector CT imaging of the abdomen and pelvis was performed following the standard protocol without IV contrast. COMPARISON:  October 21, 2011 FINDINGS: Lower chest: There is increased peripheral reticular markings of bilateral lung bases consistent with pulmonary fibrosis. There is no pneumonia. The heart size is normal. Hepatobiliary: Liver calcifications are identified unchanged. The gallbladder is normal.  Pancreas: Unremarkable. No pancreatic ductal dilatation or surrounding inflammatory changes. Spleen: Normal in size without focal abnormality. Adrenals/Urinary Tract: Adrenal glands are unremarkable. Kidneys are normal, without renal calculi, focal lesion, or hydronephrosis. Bladder is unremarkable. Stomach/Bowel: There is stranding and inflammation surrounding the sigmoid colon. There is no small bowel obstruction. The appendix is normal. Moderate bowel content is identified throughout colon. Vascular/Lymphatic: Aortic atherosclerosis. No enlarged abdominal or pelvic lymph nodes. Reproductive: Uterus and bilateral adnexa are unremarkable. Other: No abdominal wall hernia or abnormality. No abdominopelvic ascites. Musculoskeletal: Degenerative joint changes of the spine are identified. IMPRESSION: Acute sigmoid diverticulitis.  No focal abscess. Electronically Signed   By: Abelardo Diesel M.D.   On: 07/22/2016 21:45    Procedures Procedures (including critical care time)  Medications Ordered in ED Medications  metroNIDAZOLE (FLAGYL) IVPB 500 mg (500 mg Intravenous New Bag/Given 07/22/16 2218)  ciprofloxacin  (CIPRO) IVPB 400 mg (not administered)  fentaNYL (SUBLIMAZE) injection 50 mcg (50 mcg Intravenous Given 07/22/16 2030)  ondansetron (ZOFRAN) injection 4 mg (4 mg Intravenous Given 07/22/16 2030)  sodium chloride 0.9 % bolus 1,000 mL (0 mLs Intravenous Stopped 07/22/16 2136)  HYDROmorphone (DILAUDID) injection 1 mg (1 mg Intravenous Given 07/22/16 2136)     Initial Impression / Assessment and Plan / ED Course  I have reviewed the triage vital signs and the nursing notes.  Pertinent labs & imaging results that were available during my care of the patient were reviewed by me and considered in my medical decision making (see chart for details).  Clinical Course  Patient with lower abdominal pain with nausea since this morning. Denies urinary or vaginal symptoms. Denies fever.  leukocytosis noted. Questionable UTI.  CT shows no ureteral stones but does show diverticulitis uncomplicated. Patient given Cipro and Flagyl. Her pain and nausea Is controlled . She is tolerating by mouth.  She appears appropriate for outpatient treatment. Followup with PCP. Return precautions discussed.  Final Clinical Impressions(s) / ED Diagnoses   Final diagnoses:  LLQ pain  Diverticulitis large intestine w/o perforation or abscess w/o bleeding   New Prescriptions New Prescriptions   No medications on file  I personally performed the services described in this documentation, which was scribed in my presence. The recorded information has been reviewed and is accurate.    Ezequiel Essex, MD 07/23/16 [id number]

## 2016-07-22 NOTE — Discharge Instructions (Signed)
Take the antibiotics as prescribed. Keep yourself hydrated. Followup with your doctor. Return to the ED if you develop new or worsening symptoms.

## 2016-07-23 DIAGNOSIS — K5792 Diverticulitis of intestine, part unspecified, without perforation or abscess without bleeding: Secondary | ICD-10-CM | POA: Diagnosis not present

## 2016-07-23 DIAGNOSIS — Z6821 Body mass index (BMI) 21.0-21.9, adult: Secondary | ICD-10-CM | POA: Diagnosis not present

## 2016-07-23 DIAGNOSIS — Z1389 Encounter for screening for other disorder: Secondary | ICD-10-CM | POA: Diagnosis not present

## 2016-07-23 DIAGNOSIS — F909 Attention-deficit hyperactivity disorder, unspecified type: Secondary | ICD-10-CM | POA: Diagnosis not present

## 2016-07-23 DIAGNOSIS — K219 Gastro-esophageal reflux disease without esophagitis: Secondary | ICD-10-CM | POA: Diagnosis not present

## 2016-07-24 LAB — URINE CULTURE: Culture: NO GROWTH

## 2016-08-05 DIAGNOSIS — Z0001 Encounter for general adult medical examination with abnormal findings: Secondary | ICD-10-CM | POA: Diagnosis not present

## 2016-08-05 DIAGNOSIS — G894 Chronic pain syndrome: Secondary | ICD-10-CM | POA: Diagnosis not present

## 2016-08-05 DIAGNOSIS — K5732 Diverticulitis of large intestine without perforation or abscess without bleeding: Secondary | ICD-10-CM | POA: Diagnosis not present

## 2016-08-05 DIAGNOSIS — Z6820 Body mass index (BMI) 20.0-20.9, adult: Secondary | ICD-10-CM | POA: Diagnosis not present

## 2016-08-05 DIAGNOSIS — Z1389 Encounter for screening for other disorder: Secondary | ICD-10-CM | POA: Diagnosis not present

## 2016-08-05 DIAGNOSIS — E782 Mixed hyperlipidemia: Secondary | ICD-10-CM | POA: Diagnosis not present

## 2016-08-12 DIAGNOSIS — Z6820 Body mass index (BMI) 20.0-20.9, adult: Secondary | ICD-10-CM | POA: Diagnosis not present

## 2016-08-12 DIAGNOSIS — Z1389 Encounter for screening for other disorder: Secondary | ICD-10-CM | POA: Diagnosis not present

## 2016-09-17 ENCOUNTER — Ambulatory Visit (HOSPITAL_COMMUNITY)
Admission: RE | Admit: 2016-09-17 | Discharge: 2016-09-17 | Disposition: A | Discharge status: Home / Self Care | Payer: BLUE CROSS/BLUE SHIELD | Source: Ambulatory Visit | Attending: Internal Medicine | Admitting: Internal Medicine

## 2016-09-17 ENCOUNTER — Other Ambulatory Visit (HOSPITAL_COMMUNITY): Payer: Self-pay | Admitting: Internal Medicine

## 2016-09-17 DIAGNOSIS — R05 Cough: Secondary | ICD-10-CM | POA: Insufficient documentation

## 2016-09-17 DIAGNOSIS — R059 Cough, unspecified: Secondary | ICD-10-CM

## 2016-11-29 DIAGNOSIS — Z6820 Body mass index (BMI) 20.0-20.9, adult: Secondary | ICD-10-CM | POA: Diagnosis not present

## 2016-11-29 DIAGNOSIS — F909 Attention-deficit hyperactivity disorder, unspecified type: Secondary | ICD-10-CM | POA: Diagnosis not present

## 2016-11-29 DIAGNOSIS — F419 Anxiety disorder, unspecified: Secondary | ICD-10-CM | POA: Diagnosis not present

## 2017-02-09 DIAGNOSIS — Z6820 Body mass index (BMI) 20.0-20.9, adult: Secondary | ICD-10-CM | POA: Diagnosis not present

## 2017-02-09 DIAGNOSIS — Z1389 Encounter for screening for other disorder: Secondary | ICD-10-CM | POA: Diagnosis not present

## 2017-02-09 DIAGNOSIS — F900 Attention-deficit hyperactivity disorder, predominantly inattentive type: Secondary | ICD-10-CM | POA: Diagnosis not present

## 2017-03-06 DIAGNOSIS — K5792 Diverticulitis of intestine, part unspecified, without perforation or abscess without bleeding: Secondary | ICD-10-CM | POA: Diagnosis not present

## 2017-03-06 DIAGNOSIS — R1032 Left lower quadrant pain: Secondary | ICD-10-CM | POA: Diagnosis not present

## 2017-03-17 DIAGNOSIS — Z1389 Encounter for screening for other disorder: Secondary | ICD-10-CM | POA: Diagnosis not present

## 2017-03-17 DIAGNOSIS — Z6820 Body mass index (BMI) 20.0-20.9, adult: Secondary | ICD-10-CM | POA: Diagnosis not present

## 2017-03-17 DIAGNOSIS — J329 Chronic sinusitis, unspecified: Secondary | ICD-10-CM | POA: Diagnosis not present

## 2017-03-17 DIAGNOSIS — G894 Chronic pain syndrome: Secondary | ICD-10-CM | POA: Diagnosis not present

## 2017-03-17 DIAGNOSIS — F419 Anxiety disorder, unspecified: Secondary | ICD-10-CM | POA: Diagnosis not present

## 2017-04-04 DIAGNOSIS — K219 Gastro-esophageal reflux disease without esophagitis: Secondary | ICD-10-CM | POA: Diagnosis not present

## 2017-04-04 DIAGNOSIS — Z6820 Body mass index (BMI) 20.0-20.9, adult: Secondary | ICD-10-CM | POA: Diagnosis not present

## 2017-04-04 DIAGNOSIS — M62830 Muscle spasm of back: Secondary | ICD-10-CM | POA: Diagnosis not present

## 2017-04-04 DIAGNOSIS — Z1389 Encounter for screening for other disorder: Secondary | ICD-10-CM | POA: Diagnosis not present

## 2017-04-04 DIAGNOSIS — M5431 Sciatica, right side: Secondary | ICD-10-CM | POA: Diagnosis not present

## 2017-05-02 DIAGNOSIS — M5431 Sciatica, right side: Secondary | ICD-10-CM | POA: Diagnosis not present

## 2017-05-02 DIAGNOSIS — Z6821 Body mass index (BMI) 21.0-21.9, adult: Secondary | ICD-10-CM | POA: Diagnosis not present

## 2017-05-02 DIAGNOSIS — G894 Chronic pain syndrome: Secondary | ICD-10-CM | POA: Diagnosis not present

## 2017-05-02 DIAGNOSIS — F909 Attention-deficit hyperactivity disorder, unspecified type: Secondary | ICD-10-CM | POA: Diagnosis not present

## 2017-05-02 DIAGNOSIS — F419 Anxiety disorder, unspecified: Secondary | ICD-10-CM | POA: Diagnosis not present

## 2017-06-27 DIAGNOSIS — Z6821 Body mass index (BMI) 21.0-21.9, adult: Secondary | ICD-10-CM | POA: Diagnosis not present

## 2017-06-27 DIAGNOSIS — F909 Attention-deficit hyperactivity disorder, unspecified type: Secondary | ICD-10-CM | POA: Diagnosis not present

## 2017-06-27 DIAGNOSIS — Z1389 Encounter for screening for other disorder: Secondary | ICD-10-CM | POA: Diagnosis not present

## 2017-07-25 ENCOUNTER — Other Ambulatory Visit (HOSPITAL_COMMUNITY): Payer: Self-pay | Admitting: Internal Medicine

## 2017-07-25 DIAGNOSIS — Z1231 Encounter for screening mammogram for malignant neoplasm of breast: Secondary | ICD-10-CM

## 2017-07-28 ENCOUNTER — Ambulatory Visit (HOSPITAL_COMMUNITY)
Admission: RE | Admit: 2017-07-28 | Discharge: 2017-07-28 | Disposition: A | Discharge status: Home / Self Care | Payer: BLUE CROSS/BLUE SHIELD | Source: Ambulatory Visit | Attending: Internal Medicine | Admitting: Internal Medicine

## 2017-07-28 DIAGNOSIS — Z1231 Encounter for screening mammogram for malignant neoplasm of breast: Secondary | ICD-10-CM | POA: Insufficient documentation

## 2017-08-22 DIAGNOSIS — Z1389 Encounter for screening for other disorder: Secondary | ICD-10-CM | POA: Diagnosis not present

## 2017-08-22 DIAGNOSIS — Z6821 Body mass index (BMI) 21.0-21.9, adult: Secondary | ICD-10-CM | POA: Diagnosis not present

## 2017-08-22 DIAGNOSIS — F419 Anxiety disorder, unspecified: Secondary | ICD-10-CM | POA: Diagnosis not present

## 2017-12-22 DIAGNOSIS — Z1389 Encounter for screening for other disorder: Secondary | ICD-10-CM | POA: Diagnosis not present

## 2017-12-22 DIAGNOSIS — R252 Cramp and spasm: Secondary | ICD-10-CM | POA: Diagnosis not present

## 2017-12-22 DIAGNOSIS — M7121 Synovial cyst of popliteal space [Baker], right knee: Secondary | ICD-10-CM | POA: Diagnosis not present

## 2017-12-22 DIAGNOSIS — F909 Attention-deficit hyperactivity disorder, unspecified type: Secondary | ICD-10-CM | POA: Diagnosis not present

## 2017-12-22 DIAGNOSIS — Z6822 Body mass index (BMI) 22.0-22.9, adult: Secondary | ICD-10-CM | POA: Diagnosis not present

## 2018-01-06 DIAGNOSIS — F419 Anxiety disorder, unspecified: Secondary | ICD-10-CM | POA: Diagnosis not present

## 2018-01-06 DIAGNOSIS — Z6822 Body mass index (BMI) 22.0-22.9, adult: Secondary | ICD-10-CM | POA: Diagnosis not present

## 2018-01-06 DIAGNOSIS — Z1389 Encounter for screening for other disorder: Secondary | ICD-10-CM | POA: Diagnosis not present

## 2018-01-06 DIAGNOSIS — F909 Attention-deficit hyperactivity disorder, unspecified type: Secondary | ICD-10-CM | POA: Diagnosis not present

## 2018-05-08 DIAGNOSIS — Z1389 Encounter for screening for other disorder: Secondary | ICD-10-CM | POA: Diagnosis not present

## 2018-05-08 DIAGNOSIS — Z6821 Body mass index (BMI) 21.0-21.9, adult: Secondary | ICD-10-CM | POA: Diagnosis not present

## 2018-05-08 DIAGNOSIS — F909 Attention-deficit hyperactivity disorder, unspecified type: Secondary | ICD-10-CM | POA: Diagnosis not present

## 2018-05-08 DIAGNOSIS — Z01419 Encounter for gynecological examination (general) (routine) without abnormal findings: Secondary | ICD-10-CM | POA: Diagnosis not present

## 2018-05-08 DIAGNOSIS — G894 Chronic pain syndrome: Secondary | ICD-10-CM | POA: Diagnosis not present

## 2018-05-08 DIAGNOSIS — F419 Anxiety disorder, unspecified: Secondary | ICD-10-CM | POA: Diagnosis not present

## 2018-05-08 DIAGNOSIS — K219 Gastro-esophageal reflux disease without esophagitis: Secondary | ICD-10-CM | POA: Diagnosis not present

## 2018-05-08 DIAGNOSIS — Z0000 Encounter for general adult medical examination without abnormal findings: Secondary | ICD-10-CM | POA: Diagnosis not present

## 2018-05-31 DIAGNOSIS — M47816 Spondylosis without myelopathy or radiculopathy, lumbar region: Secondary | ICD-10-CM | POA: Diagnosis not present

## 2018-05-31 DIAGNOSIS — G894 Chronic pain syndrome: Secondary | ICD-10-CM | POA: Diagnosis not present

## 2018-05-31 DIAGNOSIS — Z6821 Body mass index (BMI) 21.0-21.9, adult: Secondary | ICD-10-CM | POA: Diagnosis not present

## 2018-05-31 DIAGNOSIS — M1991 Primary osteoarthritis, unspecified site: Secondary | ICD-10-CM | POA: Diagnosis not present

## 2018-05-31 DIAGNOSIS — F909 Attention-deficit hyperactivity disorder, unspecified type: Secondary | ICD-10-CM | POA: Diagnosis not present

## 2018-05-31 DIAGNOSIS — J45909 Unspecified asthma, uncomplicated: Secondary | ICD-10-CM | POA: Diagnosis not present

## 2018-05-31 DIAGNOSIS — F419 Anxiety disorder, unspecified: Secondary | ICD-10-CM | POA: Diagnosis not present

## 2018-06-05 DIAGNOSIS — M1991 Primary osteoarthritis, unspecified site: Secondary | ICD-10-CM | POA: Diagnosis not present

## 2018-06-05 DIAGNOSIS — E748 Other specified disorders of carbohydrate metabolism: Secondary | ICD-10-CM | POA: Diagnosis not present

## 2018-06-05 DIAGNOSIS — Z0000 Encounter for general adult medical examination without abnormal findings: Secondary | ICD-10-CM | POA: Diagnosis not present

## 2018-06-05 DIAGNOSIS — Z1389 Encounter for screening for other disorder: Secondary | ICD-10-CM | POA: Diagnosis not present

## 2018-06-05 DIAGNOSIS — Z79891 Long term (current) use of opiate analgesic: Secondary | ICD-10-CM | POA: Diagnosis not present

## 2018-06-26 ENCOUNTER — Other Ambulatory Visit (HOSPITAL_COMMUNITY): Payer: Self-pay | Admitting: Internal Medicine

## 2018-06-26 DIAGNOSIS — Z1231 Encounter for screening mammogram for malignant neoplasm of breast: Secondary | ICD-10-CM

## 2018-07-11 DIAGNOSIS — Z6821 Body mass index (BMI) 21.0-21.9, adult: Secondary | ICD-10-CM | POA: Diagnosis not present

## 2018-07-11 DIAGNOSIS — G894 Chronic pain syndrome: Secondary | ICD-10-CM | POA: Diagnosis not present

## 2018-07-11 DIAGNOSIS — M1991 Primary osteoarthritis, unspecified site: Secondary | ICD-10-CM | POA: Diagnosis not present

## 2018-07-11 DIAGNOSIS — M5410 Radiculopathy, site unspecified: Secondary | ICD-10-CM | POA: Diagnosis not present

## 2018-07-11 DIAGNOSIS — F419 Anxiety disorder, unspecified: Secondary | ICD-10-CM | POA: Diagnosis not present

## 2018-07-11 DIAGNOSIS — Z1389 Encounter for screening for other disorder: Secondary | ICD-10-CM | POA: Diagnosis not present

## 2018-07-11 DIAGNOSIS — M5136 Other intervertebral disc degeneration, lumbar region: Secondary | ICD-10-CM | POA: Diagnosis not present

## 2018-07-19 DIAGNOSIS — Z0289 Encounter for other administrative examinations: Secondary | ICD-10-CM

## 2018-07-31 ENCOUNTER — Encounter (HOSPITAL_COMMUNITY): Payer: Self-pay

## 2018-07-31 ENCOUNTER — Ambulatory Visit (HOSPITAL_COMMUNITY)
Admission: RE | Admit: 2018-07-31 | Discharge: 2018-07-31 | Disposition: A | Discharge status: Home / Self Care | Payer: BLUE CROSS/BLUE SHIELD | Source: Ambulatory Visit | Attending: Internal Medicine | Admitting: Internal Medicine

## 2018-07-31 DIAGNOSIS — Z1231 Encounter for screening mammogram for malignant neoplasm of breast: Secondary | ICD-10-CM | POA: Insufficient documentation

## 2018-08-01 DIAGNOSIS — F419 Anxiety disorder, unspecified: Secondary | ICD-10-CM | POA: Diagnosis not present

## 2018-08-01 DIAGNOSIS — Z6821 Body mass index (BMI) 21.0-21.9, adult: Secondary | ICD-10-CM | POA: Diagnosis not present

## 2018-08-01 DIAGNOSIS — G894 Chronic pain syndrome: Secondary | ICD-10-CM | POA: Diagnosis not present

## 2018-08-01 DIAGNOSIS — M25461 Effusion, right knee: Secondary | ICD-10-CM | POA: Diagnosis not present

## 2018-08-01 DIAGNOSIS — M1991 Primary osteoarthritis, unspecified site: Secondary | ICD-10-CM | POA: Diagnosis not present

## 2018-08-01 DIAGNOSIS — K219 Gastro-esophageal reflux disease without esophagitis: Secondary | ICD-10-CM | POA: Diagnosis not present

## 2018-08-01 DIAGNOSIS — Z1389 Encounter for screening for other disorder: Secondary | ICD-10-CM | POA: Diagnosis not present

## 2018-08-10 DIAGNOSIS — F909 Attention-deficit hyperactivity disorder, unspecified type: Secondary | ICD-10-CM | POA: Diagnosis not present

## 2018-08-10 DIAGNOSIS — G894 Chronic pain syndrome: Secondary | ICD-10-CM | POA: Diagnosis not present

## 2018-08-10 DIAGNOSIS — J9801 Acute bronchospasm: Secondary | ICD-10-CM | POA: Diagnosis not present

## 2018-08-10 DIAGNOSIS — Z6821 Body mass index (BMI) 21.0-21.9, adult: Secondary | ICD-10-CM | POA: Diagnosis not present

## 2018-08-10 DIAGNOSIS — M5136 Other intervertebral disc degeneration, lumbar region: Secondary | ICD-10-CM | POA: Diagnosis not present

## 2018-08-23 DIAGNOSIS — M1991 Primary osteoarthritis, unspecified site: Secondary | ICD-10-CM | POA: Diagnosis not present

## 2018-08-23 DIAGNOSIS — Z6821 Body mass index (BMI) 21.0-21.9, adult: Secondary | ICD-10-CM | POA: Diagnosis not present

## 2018-08-23 DIAGNOSIS — G894 Chronic pain syndrome: Secondary | ICD-10-CM | POA: Diagnosis not present

## 2018-08-23 DIAGNOSIS — F419 Anxiety disorder, unspecified: Secondary | ICD-10-CM | POA: Diagnosis not present

## 2018-09-15 DIAGNOSIS — F909 Attention-deficit hyperactivity disorder, unspecified type: Secondary | ICD-10-CM | POA: Diagnosis not present

## 2018-09-15 DIAGNOSIS — G894 Chronic pain syndrome: Secondary | ICD-10-CM | POA: Diagnosis not present

## 2018-09-15 DIAGNOSIS — Z1389 Encounter for screening for other disorder: Secondary | ICD-10-CM | POA: Diagnosis not present

## 2018-09-15 DIAGNOSIS — Z6821 Body mass index (BMI) 21.0-21.9, adult: Secondary | ICD-10-CM | POA: Diagnosis not present

## 2018-10-12 DIAGNOSIS — F419 Anxiety disorder, unspecified: Secondary | ICD-10-CM | POA: Diagnosis not present

## 2018-10-12 DIAGNOSIS — F329 Major depressive disorder, single episode, unspecified: Secondary | ICD-10-CM | POA: Diagnosis not present

## 2018-10-12 DIAGNOSIS — Z1389 Encounter for screening for other disorder: Secondary | ICD-10-CM | POA: Diagnosis not present

## 2018-10-12 DIAGNOSIS — Z6822 Body mass index (BMI) 22.0-22.9, adult: Secondary | ICD-10-CM | POA: Diagnosis not present

## 2018-10-12 DIAGNOSIS — G894 Chronic pain syndrome: Secondary | ICD-10-CM | POA: Diagnosis not present

## 2018-10-12 DIAGNOSIS — K219 Gastro-esophageal reflux disease without esophagitis: Secondary | ICD-10-CM | POA: Diagnosis not present

## 2018-12-07 DIAGNOSIS — F419 Anxiety disorder, unspecified: Secondary | ICD-10-CM | POA: Diagnosis not present

## 2018-12-07 DIAGNOSIS — G4700 Insomnia, unspecified: Secondary | ICD-10-CM | POA: Diagnosis not present

## 2018-12-07 DIAGNOSIS — Z6823 Body mass index (BMI) 23.0-23.9, adult: Secondary | ICD-10-CM | POA: Diagnosis not present

## 2019-01-04 DIAGNOSIS — G4700 Insomnia, unspecified: Secondary | ICD-10-CM | POA: Diagnosis not present

## 2019-01-04 DIAGNOSIS — T50905A Adverse effect of unspecified drugs, medicaments and biological substances, initial encounter: Secondary | ICD-10-CM | POA: Diagnosis not present

## 2019-01-04 DIAGNOSIS — G894 Chronic pain syndrome: Secondary | ICD-10-CM | POA: Diagnosis not present

## 2019-01-04 DIAGNOSIS — F909 Attention-deficit hyperactivity disorder, unspecified type: Secondary | ICD-10-CM | POA: Diagnosis not present

## 2019-02-21 DIAGNOSIS — F909 Attention-deficit hyperactivity disorder, unspecified type: Secondary | ICD-10-CM | POA: Diagnosis not present

## 2019-02-21 DIAGNOSIS — Z6824 Body mass index (BMI) 24.0-24.9, adult: Secondary | ICD-10-CM | POA: Diagnosis not present

## 2019-03-22 DIAGNOSIS — M1991 Primary osteoarthritis, unspecified site: Secondary | ICD-10-CM | POA: Diagnosis not present

## 2019-03-22 DIAGNOSIS — F322 Major depressive disorder, single episode, severe without psychotic features: Secondary | ICD-10-CM | POA: Insufficient documentation

## 2019-03-22 DIAGNOSIS — Z1389 Encounter for screening for other disorder: Secondary | ICD-10-CM | POA: Diagnosis not present

## 2019-03-22 DIAGNOSIS — Z6824 Body mass index (BMI) 24.0-24.9, adult: Secondary | ICD-10-CM | POA: Diagnosis not present

## 2019-03-22 DIAGNOSIS — F329 Major depressive disorder, single episode, unspecified: Secondary | ICD-10-CM | POA: Diagnosis not present

## 2019-07-16 DIAGNOSIS — R69 Illness, unspecified: Secondary | ICD-10-CM | POA: Diagnosis not present

## 2019-08-22 DIAGNOSIS — F419 Anxiety disorder, unspecified: Secondary | ICD-10-CM | POA: Diagnosis not present

## 2019-08-22 DIAGNOSIS — E7849 Other hyperlipidemia: Secondary | ICD-10-CM | POA: Diagnosis not present

## 2019-08-22 DIAGNOSIS — R69 Illness, unspecified: Secondary | ICD-10-CM | POA: Diagnosis not present

## 2019-08-22 DIAGNOSIS — M1991 Primary osteoarthritis, unspecified site: Secondary | ICD-10-CM | POA: Diagnosis not present

## 2019-08-22 DIAGNOSIS — Z6822 Body mass index (BMI) 22.0-22.9, adult: Secondary | ICD-10-CM | POA: Diagnosis not present

## 2019-10-17 ENCOUNTER — Other Ambulatory Visit: Payer: BC Managed Care – PPO

## 2019-12-18 DIAGNOSIS — Z0001 Encounter for general adult medical examination with abnormal findings: Secondary | ICD-10-CM | POA: Diagnosis not present

## 2019-12-18 DIAGNOSIS — Z1389 Encounter for screening for other disorder: Secondary | ICD-10-CM | POA: Diagnosis not present

## 2019-12-18 DIAGNOSIS — E663 Overweight: Secondary | ICD-10-CM | POA: Diagnosis not present

## 2019-12-18 DIAGNOSIS — Z6825 Body mass index (BMI) 25.0-25.9, adult: Secondary | ICD-10-CM | POA: Diagnosis not present

## 2019-12-18 DIAGNOSIS — E782 Mixed hyperlipidemia: Secondary | ICD-10-CM | POA: Diagnosis not present

## 2019-12-18 DIAGNOSIS — F909 Attention-deficit hyperactivity disorder, unspecified type: Secondary | ICD-10-CM | POA: Diagnosis not present

## 2020-02-14 DIAGNOSIS — Z79899 Other long term (current) drug therapy: Secondary | ICD-10-CM | POA: Diagnosis not present

## 2020-02-14 DIAGNOSIS — E559 Vitamin D deficiency, unspecified: Secondary | ICD-10-CM | POA: Diagnosis not present

## 2020-02-18 DIAGNOSIS — Z6824 Body mass index (BMI) 24.0-24.9, adult: Secondary | ICD-10-CM | POA: Diagnosis not present

## 2020-02-18 DIAGNOSIS — G894 Chronic pain syndrome: Secondary | ICD-10-CM | POA: Diagnosis not present

## 2020-02-18 DIAGNOSIS — F419 Anxiety disorder, unspecified: Secondary | ICD-10-CM | POA: Diagnosis not present

## 2020-02-18 DIAGNOSIS — R197 Diarrhea, unspecified: Secondary | ICD-10-CM | POA: Diagnosis not present

## 2020-02-18 DIAGNOSIS — F909 Attention-deficit hyperactivity disorder, unspecified type: Secondary | ICD-10-CM | POA: Diagnosis not present

## 2020-02-18 DIAGNOSIS — R631 Polydipsia: Secondary | ICD-10-CM | POA: Diagnosis not present

## 2020-02-21 DIAGNOSIS — R197 Diarrhea, unspecified: Secondary | ICD-10-CM | POA: Diagnosis not present

## 2020-02-22 DIAGNOSIS — R197 Diarrhea, unspecified: Secondary | ICD-10-CM | POA: Diagnosis not present
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
## 2020-03-06 ENCOUNTER — Encounter (HOSPITAL_COMMUNITY): Payer: Self-pay | Admitting: Psychiatry

## 2020-03-06 ENCOUNTER — Other Ambulatory Visit: Payer: Self-pay

## 2020-03-06 ENCOUNTER — Telehealth (INDEPENDENT_AMBULATORY_CARE_PROVIDER_SITE_OTHER): Payer: Medicare HMO | Admitting: Psychiatry

## 2020-03-06 DIAGNOSIS — F330 Major depressive disorder, recurrent, mild: Secondary | ICD-10-CM

## 2020-03-06 NOTE — Patient Instructions (Signed)
1. Conitnue duloxetine 60 mg daily  2. Discontinue Xanax 3. Next appointment: 7/2 at 8:40

## 2020-03-12 DIAGNOSIS — E663 Overweight: Secondary | ICD-10-CM | POA: Diagnosis not present

## 2020-03-12 DIAGNOSIS — G894 Chronic pain syndrome: Secondary | ICD-10-CM | POA: Diagnosis not present

## 2020-03-12 DIAGNOSIS — M1991 Primary osteoarthritis, unspecified site: Secondary | ICD-10-CM | POA: Diagnosis not present

## 2020-03-12 DIAGNOSIS — F419 Anxiety disorder, unspecified: Secondary | ICD-10-CM | POA: Diagnosis not present

## 2020-03-12 DIAGNOSIS — Z6825 Body mass index (BMI) 25.0-25.9, adult: Secondary | ICD-10-CM | POA: Diagnosis not present

## 2020-03-19 DIAGNOSIS — Z9660 Presence of unspecified orthopedic joint implant: Secondary | ICD-10-CM | POA: Diagnosis not present

## 2020-03-24 NOTE — Progress Notes (Deleted)
BH MD/PA/NP OP Progress Note  03/24/2020 12:04 PM Sylvia [name]  MRN:  [id number]  Chief Complaint:  HPI: *** Visit Diagnosis: No diagnosis found.  Past Psychiatric History: Please see initial evaluation for full details. I have reviewed the history. No updates at this time.     Past Medical History:  Past Medical History:  Diagnosis Date  . Depression   . Diverticulosis   . GERD (gastroesophageal reflux disease)   . Hiatal hernia   . Hyperlipemia   . Hyperlipemia   . Memory loss   . Mental disorder   . Schatzki's ring     Past Surgical History:  Procedure Laterality Date  . APPENDECTOMY  1973  . BACK SURGERY    . COLONOSCOPY  09/2011   colonic diverticulosis. Normal TI to 10cm. Next TCS 09/2021  . COLONOSCOPY  10/27/2011   Procedure: COLONOSCOPY;  Surgeon: Daneil Dolin, MD;  Location: [facility];  Service: Endoscopy;  Laterality: N/A;  12:15 PM  . ESOPHAGOGASTRODUODENOSCOPY  11/18/2011   erosive reflux esophagitis-noncritical schatzki ring, hiatal hernia  . ESOPHAGOGASTRODUODENOSCOPY  11/18/2011   Procedure: ESOPHAGOGASTRODUODENOSCOPY (EGD);  Surgeon: Daneil Dolin, MD;  Location: [facility];  Service: Endoscopy;  Laterality: N/A;  8:30  . PATELLA FRACTURE SURGERY    . TUBAL LIGATION      Family Psychiatric History: Please see initial evaluation for full details. I have reviewed the history. No updates at this time.     Family History:  Family History  Problem Relation Age of Onset  . Heart attack Father   . Hyperlipidemia Father   . Depression Brother   . Colon cancer Neg Hx     Social History:  Social History   Socioeconomic History  . Marital status: Married    Spouse name: Eduard Clos  . Number of children: 5  . Years of education: 48  . Highest education level: Not on file  Occupational History  . Occupation: Truck Education administrator: Academic librarian: [employer]  Tobacco Use  . Smoking status: Former Smoker    Packs/day: 0.25     Years: 15.00    Pack years: 3.75    Types: Cigarettes    Quit date: 06/13/2013    Years since quitting: 6.7  . Smokeless tobacco: Never Used  . Tobacco comment: 7-8 cigaretts  Substance and Sexual Activity  . Alcohol use: Yes    Alcohol/week: 2.0 standard drinks    Types: 2 Shots of liquor per week    Comment: Infrequent.  . Drug use: No  . Sexual activity: Not on file  Other Topics Concern  . Not on file  Social History Narrative   Patient is married Market researcher) and lives at home with her husband.   Patient has three children.   Patient has a high school education.   Patient is right-handed.   Patient drinks two cups of coffee every morning and very little tea.   Social Determinants of Health   Financial Resource Strain:   . Difficulty of Paying Living Expenses:   Food Insecurity:   . Worried About Charity fundraiser in the Last Year:   . Arboriculturist in the Last Year:   Transportation Needs:   . Film/video editor (Medical):   Marland Kitchen Lack of Transportation (Non-Medical):   Physical Activity:   . Days of Exercise per Week:   . Minutes of Exercise per Session:   Stress:   .  Feeling of Stress :   Social Connections:   . Frequency of Communication with Friends and Family:   . Frequency of Social Gatherings with Friends and Family:   . Attends Religious Services:   . Active Member of Clubs or Organizations:   . Attends Archivist Meetings:   Marland Kitchen Marital Status:     Allergies: No Known Allergies  Metabolic Disorder Labs: No results found for: HGBA1C, MPG No results found for: PROLACTIN No results found for: CHOL, TRIG, HDL, CHOLHDL, VLDL, LDLCALC No results found for: TSH  Therapeutic Level Labs: No results found for: LITHIUM No results found for: VALPROATE No components found for:  CBMZ  Current Medications: Current Outpatient Medications  Medication Sig Dispense Refill  . alprazolam (XANAX) 2 MG tablet Take 2 mg by mouth 3 (three) times daily  as needed for sleep. (Patient not taking: Reported on 03/06/2020)    . amphetamine-dextroamphetamine (ADDERALL) 20 MG tablet Take 30 mg by mouth 2 (two) times daily.  0  . atorvastatin (LIPITOR) 20 MG tablet Take 20 mg by mouth daily.    . ciprofloxacin (CIPRO) 500 MG tablet Take 1 tablet (500 mg total) by mouth 2 (two) times daily. 20 tablet 0  . DULoxetine (CYMBALTA) 60 MG capsule Take 60 mg by mouth daily.    Marland Kitchen HYDROcodone-acetaminophen (NORCO/VICODIN) 5-325 MG tablet Take 1 tablet by mouth every 4 (four) hours as needed. 10 tablet 0  . metroNIDAZOLE (FLAGYL) 500 MG tablet Take 1 tablet (500 mg total) by mouth 2 (two) times daily. 20 tablet 0  . ondansetron (ZOFRAN) 4 MG tablet Take 1 tablet (4 mg total) by mouth every 6 (six) hours. 12 tablet 0   No current facility-administered medications for this visit.     Musculoskeletal: Strength & Muscle Tone: N/A Gait & Station: N/A Patient leans: N/A  Psychiatric Specialty Exam: Review of Systems  There were no vitals taken for this visit.There is no height or weight on file to calculate BMI.  General Appearance: {Appearance:22683}  Eye Contact:  {BHH EYE CONTACT:22684}  Speech:  Clear and Coherent  Volume:  Normal  Mood:  {BHH MOOD:22306}  Affect:  {Affect (PAA):22687}  Thought Process:  Coherent  Orientation:  Full (Time, Place, and Person)  Thought Content: Logical   Suicidal Thoughts:  {ST/HT (PAA):22692}  Homicidal Thoughts:  {ST/HT (PAA):22692}  Memory:  Immediate;   Good  Judgement:  {Judgement (PAA):22694}  Insight:  {Insight (PAA):22695}  Psychomotor Activity:  Normal  Concentration:  Concentration: Good and Attention Span: Good  Recall:  Good  Fund of Knowledge: Good  Language: Good  Akathisia:  No  Handed:  Right  AIMS (if indicated): not done  Assets:  Communication Skills Desire for Improvement  ADL's:  Intact  Cognition: WNL  Sleep:  {BHH GOOD/FAIR/POOR:22877}   Screenings:   Assessment and Plan:  Sylvia [name] is a 64 y.o. year old female with a history of depression, ADHD, GERD, hyperlipidemia, who presents for follow up appointment for below.     1. Mild episode of recurrent major depressive disorder ([place]) She reports mild depressive symptoms with anxiety. She is on duloxetine, prescribed by PCP. Will continue current dose.   2. 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 It is unclear whether it is related to ADHD or more attributable to her baseline character. No obvious signs of mania or psychosis.  She reports history of ADHD  and did have neuropsych testing. She agrees to bring the record to the office. She understands that Adderall will not be prescribed at this time until further evaluation.   Plan 1. Continue duloxetine 60 mg daily  2. Discontinue Xanax 3. Next appointment: 7/2 at 8:40 for 30 mins, video -  She is on Dextroamp-Amphetamine 30 MG Tab BID, prescribed by her PCP. She agrees to ask refills to her PCP until we receive    The patient demonstrates the following risk factors for suicide: Chronic risk factors for suicide include: psychiatric disorder of depression. Acute risk factors for suicide include: family or marital conflict. Protective factors for this patient include: hope for the future. Considering these factors, the overall suicide risk at this point appears to be low. Patient is appropriate for outpatient follow up.   Norman Clay, MD 03/24/2020, 12:04 PM

## 2020-03-28 ENCOUNTER — Telehealth (HOSPITAL_COMMUNITY): Payer: Medicare HMO | Admitting: Psychiatry

## 2020-03-28 ENCOUNTER — Encounter (HOSPITAL_COMMUNITY): Payer: Self-pay

## 2020-04-10 DIAGNOSIS — M25561 Pain in right knee: Secondary | ICD-10-CM | POA: Diagnosis not present

## 2020-04-10 DIAGNOSIS — G5781 Other specified mononeuropathies of right lower limb: Secondary | ICD-10-CM | POA: Diagnosis not present

## 2020-04-10 DIAGNOSIS — Z96651 Presence of right artificial knee joint: Secondary | ICD-10-CM | POA: Diagnosis not present

## 2020-04-30 DIAGNOSIS — R69 Illness, unspecified: Secondary | ICD-10-CM | POA: Diagnosis not present

## 2020-06-04 DIAGNOSIS — Z6825 Body mass index (BMI) 25.0-25.9, adult: Secondary | ICD-10-CM | POA: Diagnosis not present

## 2020-06-04 DIAGNOSIS — R1310 Dysphagia, unspecified: Secondary | ICD-10-CM | POA: Diagnosis not present

## 2020-06-04 DIAGNOSIS — K219 Gastro-esophageal reflux disease without esophagitis: Secondary | ICD-10-CM | POA: Diagnosis not present

## 2020-06-04 DIAGNOSIS — F419 Anxiety disorder, unspecified: Secondary | ICD-10-CM | POA: Diagnosis not present

## 2020-06-12 DIAGNOSIS — Z0100 Encounter for examination of eyes and vision without abnormal findings: Secondary | ICD-10-CM | POA: Diagnosis not present

## 2020-06-12 DIAGNOSIS — H524 Presbyopia: Secondary | ICD-10-CM | POA: Diagnosis not present

## 2020-07-17 DIAGNOSIS — F909 Attention-deficit hyperactivity disorder, unspecified type: Secondary | ICD-10-CM | POA: Diagnosis not present

## 2020-07-17 DIAGNOSIS — S2002XA Contusion of left breast, initial encounter: Secondary | ICD-10-CM | POA: Diagnosis not present

## 2020-07-17 DIAGNOSIS — F419 Anxiety disorder, unspecified: Secondary | ICD-10-CM | POA: Diagnosis not present

## 2020-07-17 DIAGNOSIS — Z6824 Body mass index (BMI) 24.0-24.9, adult: Secondary | ICD-10-CM | POA: Diagnosis not present

## 2020-07-17 DIAGNOSIS — R58 Hemorrhage, not elsewhere classified: Secondary | ICD-10-CM | POA: Diagnosis not present

## 2020-09-25 DIAGNOSIS — F419 Anxiety disorder, unspecified: Secondary | ICD-10-CM | POA: Diagnosis not present

## 2020-09-25 DIAGNOSIS — M1991 Primary osteoarthritis, unspecified site: Secondary | ICD-10-CM | POA: Diagnosis not present

## 2020-09-25 DIAGNOSIS — Z6823 Body mass index (BMI) 23.0-23.9, adult: Secondary | ICD-10-CM | POA: Diagnosis not present

## 2020-09-25 DIAGNOSIS — M2550 Pain in unspecified joint: Secondary | ICD-10-CM | POA: Diagnosis not present

## 2020-09-25 DIAGNOSIS — F909 Attention-deficit hyperactivity disorder, unspecified type: Secondary | ICD-10-CM | POA: Diagnosis not present

## 2020-09-25 DIAGNOSIS — K219 Gastro-esophageal reflux disease without esophagitis: Secondary | ICD-10-CM | POA: Diagnosis not present

## 2020-11-12 ENCOUNTER — Emergency Department (HOSPITAL_COMMUNITY): Payer: Medicare HMO

## 2020-11-12 ENCOUNTER — Other Ambulatory Visit: Payer: Self-pay

## 2020-11-12 ENCOUNTER — Encounter (HOSPITAL_COMMUNITY): Payer: Self-pay

## 2020-11-12 ENCOUNTER — Emergency Department (HOSPITAL_COMMUNITY)
Admission: EM | Admit: 2020-11-12 | Discharge: 2020-11-12 | Disposition: A | Discharge status: Home / Self Care | Payer: Medicare HMO | Attending: Emergency Medicine | Admitting: Emergency Medicine

## 2020-11-12 DIAGNOSIS — I712 Thoracic aortic aneurysm, without rupture, unspecified: Secondary | ICD-10-CM

## 2020-11-12 DIAGNOSIS — J90 Pleural effusion, not elsewhere classified: Secondary | ICD-10-CM | POA: Diagnosis not present

## 2020-11-12 DIAGNOSIS — R0781 Pleurodynia: Secondary | ICD-10-CM | POA: Diagnosis not present

## 2020-11-12 DIAGNOSIS — R1084 Generalized abdominal pain: Secondary | ICD-10-CM | POA: Diagnosis not present

## 2020-11-12 DIAGNOSIS — R918 Other nonspecific abnormal finding of lung field: Secondary | ICD-10-CM | POA: Insufficient documentation

## 2020-11-12 DIAGNOSIS — J984 Other disorders of lung: Secondary | ICD-10-CM | POA: Diagnosis not present

## 2020-11-12 DIAGNOSIS — J189 Pneumonia, unspecified organism: Secondary | ICD-10-CM | POA: Diagnosis not present

## 2020-11-12 DIAGNOSIS — R079 Chest pain, unspecified: Secondary | ICD-10-CM | POA: Diagnosis not present

## 2020-11-12 DIAGNOSIS — Z87891 Personal history of nicotine dependence: Secondary | ICD-10-CM | POA: Insufficient documentation

## 2020-11-12 LAB — BASIC METABOLIC PANEL
Anion gap: 6 (ref 5–15)
BUN: 16 mg/dL (ref 8–23)
CO2: 24 mmol/L (ref 22–32)
Calcium: 9 mg/dL (ref 8.9–10.3)
Chloride: 104 mmol/L (ref 98–111)
Creatinine, Ser: 0.73 mg/dL (ref 0.44–1.00)
GFR, Estimated: >60 mL/min (ref >60)
Glucose, Bld: 88 mg/dL (ref 70–99)
Potassium: 3.4 mmol/L — ABNORMAL LOW (ref 3.5–5.1)
Sodium: 134 mmol/L — ABNORMAL LOW (ref 135–145)

## 2020-11-12 LAB — URINALYSIS, ROUTINE W REFLEX MICROSCOPIC
Bilirubin Urine: NEGATIVE
Glucose, UA: NEGATIVE mg/dL
Hgb urine dipstick: NEGATIVE
Ketones, ur: NEGATIVE mg/dL
Nitrite: NEGATIVE
Protein, ur: NEGATIVE mg/dL
Specific Gravity, Urine: 1.011 (ref 1.005–1.030)
pH: 5 (ref 5.0–8.0)

## 2020-11-12 LAB — CBC WITH DIFFERENTIAL/PLATELET
Abs Immature Granulocytes: 0.02 10*3/uL (ref 0.00–0.07)
Basophils Absolute: 0 10*3/uL (ref 0.0–0.1)
Basophils Relative: 0 %
Eosinophils Absolute: 0.1 10*3/uL (ref 0.0–0.5)
Eosinophils Relative: 2 %
HCT: 41 % (ref 36.0–46.0)
Hemoglobin: 12.9 g/dL (ref 12.0–15.0)
Immature Granulocytes: 0 %
Lymphocytes Relative: 31 %
Lymphs Abs: 2.2 10*3/uL (ref 0.7–4.0)
MCH: 30.3 pg (ref 26.0–34.0)
MCHC: 31.5 g/dL (ref 30.0–36.0)
MCV: 96.2 fL (ref 80.0–100.0)
Monocytes Absolute: 0.5 10*3/uL (ref 0.1–1.0)
Monocytes Relative: 7 %
Neutro Abs: 4.2 10*3/uL (ref 1.7–7.7)
Neutrophils Relative %: 60 %
Platelets: 258 10*3/uL (ref 150–400)
RBC: 4.26 MIL/uL (ref 3.87–5.11)
RDW: 14.1 % (ref 11.5–15.5)
WBC: 7 10*3/uL (ref 4.0–10.5)
nRBC: 0 % (ref 0.0–0.2)

## 2020-11-12 LAB — D-DIMER, QUANTITATIVE: D-Dimer, Quant: 0.85 ug/mL-FEU — ABNORMAL HIGH (ref 0.00–0.50)

## 2020-11-12 LAB — TROPONIN I (HIGH SENSITIVITY): Troponin I (High Sensitivity): 3 ng/L (ref <18)

## 2020-11-12 MED ORDER — MORPHINE SULFATE (PF) 4 MG/ML IV SOLN
4.0000 mg | Freq: Once | INTRAVENOUS | Status: AC
  Administered 2020-11-12: 4 mg via INTRAVENOUS
  Filled 2020-11-12: qty 1

## 2020-11-12 MED ORDER — IOHEXOL 350 MG/ML SOLN
100.0000 mL | Freq: Once | INTRAVENOUS | Status: AC | PRN
  Administered 2020-11-12: 100 mL via INTRAVENOUS

## 2020-11-12 MED ORDER — DOXYCYCLINE HYCLATE 100 MG PO CAPS: 100.0000 mg | ORAL_CAPSULE | Freq: Two times a day (BID) | ORAL | 0 refills | Status: AC

## 2020-11-12 NOTE — ED Provider Notes (Signed)
[facility] EMERGENCY DEPARTMENT Provider Note   CSN: [id number] Arrival date & time: 11/12/20  1731     History Chief Complaint  Patient presents with  . Generalized Body Aches    Sylvia [name] is a 65 y.o. female.  HPI   Patient with significant medical history of depression, diverticulitis, GERD, hiatal hernia presents with chief complaint of left-sided rib pain. Patient endorses the pain is started approximately 5 days ago and has progressed  gotten worse. She describes a sharp sensation in her right rib exacerbated with breathing and movement, she has associated shortness of breath as she states it hurts with deep breath. Patient denies chest pain, becoming diaphoretic, lightheaded or dizziness, she has no cardiac history, no history of DVTs or PEs. Patient denies abdominal pain, nausea, vomiting, urinary symptoms, constipation or diarrhea. She denies ever experienced this in the past. Patient states she went to a urgent care where they did a x-ray of her lung so that she might have some fluid sent a mass her lung. This concerned her and came here to the emergency room for further evaluation. Patient denies headaches, fevers, chills, abdominal pain, nausea, vomit, diarrhea, worsening pedal edema.  Past Medical History:  Diagnosis Date  . Depression   . Diverticulosis   . GERD (gastroesophageal reflux disease)   . Hiatal hernia   . Hyperlipemia   . Hyperlipemia   . Memory loss   . Mental disorder   . Schatzki's ring     Patient Active Problem List   Diagnosis Date Noted  . Memory loss 11/14/2013  . Hyperlipemia   . RUQ pain 10/28/2011  . Epigastric pain 10/28/2011  . GERD (gastroesophageal reflux disease) 10/28/2011    Past Surgical History:  Procedure Laterality Date  . APPENDECTOMY  1973  . BACK SURGERY    . COLONOSCOPY  09/2011   colonic diverticulosis. Normal TI to 10cm. Next TCS 09/2021  . COLONOSCOPY  10/27/2011   Procedure: COLONOSCOPY;  Surgeon: Daneil Dolin,  MD;  Location: [facility];  Service: Endoscopy;  Laterality: N/A;  12:15 PM  . ESOPHAGOGASTRODUODENOSCOPY  11/18/2011   erosive reflux esophagitis-noncritical schatzki ring, hiatal hernia  . ESOPHAGOGASTRODUODENOSCOPY  11/18/2011   Procedure: ESOPHAGOGASTRODUODENOSCOPY (EGD);  Surgeon: Daneil Dolin, MD;  Location: [facility];  Service: Endoscopy;  Laterality: N/A;  8:30  . PATELLA FRACTURE SURGERY    . TUBAL LIGATION       OB History   No obstetric history on file.     Family History  Problem Relation Age of Onset  . Heart attack Father   . Hyperlipidemia Father   . Depression Brother   . Colon cancer Neg Hx     Social History   Tobacco Use  . Smoking status: Former Smoker    Packs/day: 0.25    Years: 15.00    Pack years: 3.75    Types: Cigarettes    Quit date: 06/13/2013    Years since quitting: 7.4  . Smokeless tobacco: Never Used  . Tobacco comment: 7-8 cigaretts  Substance Use Topics  . Alcohol use: Yes    Alcohol/week: 2.0 standard drinks    Types: 2 Shots of liquor per week    Comment: Infrequent.  . Drug use: No    Home Medications Prior to Admission medications   Medication Sig Start Date End Date Taking? Authorizing Provider  albuterol (VENTOLIN) 2 MG/5ML syrup Take 5 mLs by mouth in the morning, at noon, and at bedtime. 10/27/20  Yes [provider]  alprazolam Duanne Moron) 2 MG tablet Take 2 mg by mouth 3 (three) times daily as needed for sleep.   Yes [provider]  amphetamine-dextroamphetamine (ADDERALL) 20 MG tablet Take 30 mg by mouth 2 (two) times daily. 06/25/16  Yes [provider]  atorvastatin (LIPITOR) 20 MG tablet Take 20 mg by mouth daily.   Yes [provider]  doxycycline (VIBRAMYCIN) 100 MG capsule Take 1 capsule (100 mg total) by mouth 2 (two) times daily for 7 days. 11/12/20 11/19/20 Yes Marcello Fennel, PA-C  DULoxetine (CYMBALTA) 60 MG capsule Take 60 mg by mouth daily.   Yes [provider]   FLUoxetine (PROZAC) 40 MG capsule Take 40 mg by mouth daily. 10/12/18  Yes [provider]  ciprofloxacin (CIPRO) 500 MG tablet Take 1 tablet (500 mg total) by mouth 2 (two) times daily. Patient not taking: No sig reported 07/22/16   Rancour, Annie Main, MD  HYDROcodone-acetaminophen (NORCO/VICODIN) 5-325 MG tablet Take 1 tablet by mouth every 4 (four) hours as needed. Patient not taking: No sig reported 07/22/16   Rancour, Annie Main, MD  metroNIDAZOLE (FLAGYL) 500 MG tablet Take 1 tablet (500 mg total) by mouth 2 (two) times daily. Patient not taking: No sig reported 07/22/16   Rancour, Annie Main, MD  ondansetron (ZOFRAN) 4 MG tablet Take 1 tablet (4 mg total) by mouth every 6 (six) hours. Patient not taking: No sig reported 07/22/16   Ezequiel Essex, MD  pantoprazole (PROTONIX) 40 MG tablet Take 1 tablet (40 mg total) by mouth daily before breakfast. 12/01/11 12/16/11  Mahala Menghini, PA-C    Allergies    Gabapentin  Review of Systems   Review of Systems  Constitutional: Negative for chills and fever.  HENT: Negative for congestion and sore throat.   Eyes: Negative for visual disturbance.  Respiratory: Positive for shortness of breath.   Cardiovascular: Negative for chest pain.  Gastrointestinal: Negative for abdominal pain, diarrhea, nausea and vomiting.  Genitourinary: Negative for difficulty urinating, enuresis, vaginal discharge and vaginal pain.  Musculoskeletal: Negative for back pain.       Left-sided rib pain.  Skin: Negative for rash.  Neurological: Negative for dizziness and headaches.  Hematological: Does not bruise/bleed easily.    Physical Exam Updated Vital Signs BP 98/66   Pulse 71   Temp 98.1 F (36.7 C) (Oral)   Resp (!) 25   Ht 5\' 1"  (1.549 m)   Wt 61.2 kg   SpO2 99%   BMI 25.51 kg/m   Physical Exam Vitals and nursing note reviewed.  Constitutional:      General: She is not in acute distress.    Appearance: She is not ill-appearing.  HENT:      Head: Normocephalic and atraumatic.     Nose: No congestion.  Eyes:     Conjunctiva/sclera: Conjunctivae normal.  Cardiovascular:     Rate and Rhythm: Normal rate and regular rhythm.     Pulses: Normal pulses.     Heart sounds: No murmur heard. No friction rub. No gallop.   Pulmonary:     Effort: No respiratory distress.     Breath sounds: No wheezing, rhonchi or rales.  Abdominal:     Palpations: Abdomen is soft.     Tenderness: There is no abdominal tenderness. There is no right CVA tenderness or left CVA tenderness.  Musculoskeletal:     Right lower leg: No edema.     Left lower leg: No edema.  Comments: Patient has noted left-sided rib pain, along ribs 6 and 7 along the midclavicular line, there is no deformities or crepitus noted.  Patient is moving all 4 extremities at difficulty.  Skin:    General: Skin is warm and dry.  Neurological:     Mental Status: She is alert.  Psychiatric:        Mood and Affect: Mood normal.     ED Results / Procedures / Treatments   Labs (all labs ordered are listed, but only abnormal results are displayed) Labs Reviewed  BASIC METABOLIC PANEL - Abnormal; Notable for the following components:      Result Value   Sodium 134 (*)    Potassium 3.4 (*)    All other components within normal limits  D-DIMER, QUANTITATIVE - Abnormal; Notable for the following components:   D-Dimer, Quant 0.85 (*)    All other components within normal limits  URINALYSIS, ROUTINE W REFLEX MICROSCOPIC - Abnormal; Notable for the following components:   Leukocytes,Ua MODERATE (*)    Bacteria, UA RARE (*)    All other components within normal limits  CBC WITH DIFFERENTIAL/PLATELET  TROPONIN I (HIGH SENSITIVITY)  TROPONIN I (HIGH SENSITIVITY)    EKG EKG Interpretation  Date/Time:  Wednesday November 12 2020 21:23:46 EST Ventricular Rate:  69 PR Interval:    QRS Duration: 92 QT Interval:  396 QTC Calculation: 425 R Axis:   20 Text  Interpretation: Sinus rhythm Baseline wander No previous tracing Confirmed by Lajean Saver [id number]) on 11/12/2020 9:39:01 PM   Radiology DG Chest 2 View  Result Date: 11/12/2020 CLINICAL DATA:  Chest pain EXAM: CHEST - 2 VIEW COMPARISON:  09/17/2016 FINDINGS: 2.9 cm right upper lobe opacity with possible cavitation. Mild reticular interstitial opacities at the bases, could reflect chronic interstitial disease. Hazy atelectasis or mild infiltrate left lung base. Normal heart size. No pneumothorax. Left calcific tendinitis. IMPRESSION: 2.9 cm right upper lobe opacity with possible cavitation, question cavitary infection or cavitary mass. CT chest recommended for further evaluation. Fine reticular opacity at the bases, possible fibrosis. Hazy left lung base atelectasis or mild pneumonia Electronically Signed   By: Donavan Foil M.D.   On: 11/12/2020 22:03   CT Angio Chest PE W and/or Wo Contrast  Result Date: 11/12/2020 CLINICAL DATA:  Left-sided rib pain EXAM: CT ANGIOGRAPHY CHEST WITH CONTRAST TECHNIQUE: Multidetector CT imaging of the chest was performed using the standard protocol during bolus administration of intravenous contrast. Multiplanar CT image reconstructions and MIPs were obtained to evaluate the vascular anatomy. CONTRAST:  16mL OMNIPAQUE IOHEXOL 350 MG/ML SOLN COMPARISON:  Chest x-ray 11/12/2020 FINDINGS: Cardiovascular: Satisfactory opacification of the pulmonary arteries to the segmental level. No evidence of pulmonary embolism. Mild aneurysmal dilatation of the ascending aorta measuring up to 4.1 cm. No dissection seen. Mild aortic atherosclerosis. Coronary vascular calcification. Normal cardiac size. No pericardial effusion Mediastinum/Nodes: Midline trachea. No suspicious thyroid mass. Borderline AP window lymph nodes measuring up to 9 mm. Esophagus within normal limits. Lungs/Pleura: Advanced emphysema. Spiculated cavitary mass within the posterior right upper lobe measures 2.8 by 2.3  by 2.4 cm. Partial consolidation within the lingula. Mild basilar fibrosis. Upper Abdomen: No acute abnormality. Calcified lesion partially visualized in the liver, possibly due to remote trauma 2 or infection. Musculoskeletal: Subacute appearing right third anterior rib fracture. Review of the MIP images confirms the above findings. IMPRESSION: 1. Negative for acute pulmonary embolus or aortic dissection. 2. 2.8 cm spiculated cavitary mass in the posterior right upper lobe,  concerning for primary lung carcinoma. Pulmonary consultation is recommended. 3. Advanced emphysema. Partial consolidation within the lingula, could reflect atelectasis or mild pneumonia. 4. Subacute appearing right third anterior rib fracture. 5. Emphysema and aortic atherosclerosis. 6. Mild aneurysmal dilatation of the ascending aorta up to 4.1 cm. Recommend annual imaging followup by CTA or MRA. This recommendation follows 2010 ACCF/AHA/AATS/ACR/ASA/SCA/SCAI/SIR/STS/SVM Guidelines for the Diagnosis and Management of Patients with Thoracic Aortic Disease. Circulation. 2010; 121: P509-T267. Aortic aneurysm NOS (ICD10-I71.9) Aortic aneurysm NOS (ICD10-I71.9).Aortic Atherosclerosis (ICD10-I70.0) and Emphysema (ICD10-J43.9). Electronically Signed   By: Donavan Foil M.D.   On: 11/12/2020 23:11    Procedures Procedures   Medications Ordered in ED Medications  morphine 4 MG/ML injection 4 mg (4 mg Intravenous Given 11/12/20 2130)  iohexol (OMNIPAQUE) 350 MG/ML injection 100 mL (100 mLs Intravenous Contrast Given 11/12/20 2241)  morphine 4 MG/ML injection 4 mg (4 mg Intravenous Given 11/12/20 2336)    ED Course  I have reviewed the triage vital signs and the nursing notes.  Pertinent labs & imaging results that were available during my care of the patient were reviewed by me and considered in my medical decision making (see chart for details).    MDM Rules/Calculators/A&P                          Initial impression-patient presents  with left-sided rib pain. She appeared to be in acute distress, vital signs reassuring. Will obtain chest pain work-up, add on D-dimer, provide patient with morphine and reevaluate.  Work-up-CBC unremarkable.  BMP shows slight hyponatremia of 134, slight hypokalemia 3.4, no other acute abnormalities noted.  D-dimer elevated at 0.85.  Initial troponin is 3.  UA shows moderate leukocytes, 31-50 white blood cells, rare bacteria.  Chest x-ray shows 2.9 cm right upper lobe opacity possibly cavernous, infectious versus mass.  Recommend CT imaging for further evaluation.  Left lung shows hazy opacity atelectasis versus mild pneumonia.  CT angio shows 2.8 spiculated lung nodule concerning for primary lung carcinoma.  There is also partial consolidation with the lingula concerning for atelectasis versus mild pneumonia.  Dilation of the ascending aorta measuring up to 4.1 cm recommends CTA or MRI annually for further evaluation.   Rule out-low suspicion for ACS as as chest pain is atypical, patient has no cardiac history, EKG sinus rhythm without signs of ischemia.  Initial troponin is 3.  Will defer second troponin as patient has chest pain for greater than 12 hours would expect elevation at this time.  Low suspicion for PE as CT angio was negative.  Low suspicion for rib fracture or pneumothorax as lung sounds are clear bilaterally, imaging negative for acute findings.  Low suspicion for UTI or pyelonephritis as patient denies urinary symptoms, no CVA tenderness noted on my exam.  UA is negative for nitrates or leukocytes.  There is noted leukocytes and white blood cells but rare bacteria.  Will not treat for asymptomatic cystitis.  Plan-patient has unclear etiology of left-sided rib pain, possible she has metastasis of that side causing her to have pain.  There is some consolidations for possible pneumonia will start her on antibiotics.  Will have patient follow-up with oncology for further evaluation of the mass in  her lungs, will have her follow-up with thoracic surgery for further follow-up of her aneurysm.  Vital signs have remained stable, no indication for hospital admission.  Patient discussed with attending and they agreed with assessment and plan.  Patient given  at home care as well strict return precautions.  Patient verbalized that they understood agreed to said plan.   Final Clinical Impression(s) / ED Diagnoses Final diagnoses:  Rib pain  Lung mass  Thoracic aortic aneurysm without rupture ([place])    Rx / DC Orders ED Discharge Orders         Ordered    doxycycline (VIBRAMYCIN) 100 MG capsule  2 times daily        11/12/20 2340           Aron Baba 11/12/20 2358    Lajean Saver, MD 11/13/20 1526

## 2020-11-12 NOTE — ED Triage Notes (Signed)
Pt to er, pt states that for the past month she has been having joint pain, states that she has seen her pmd and he has referred her to a rheumatologist, states that she is here because she is having L rib pain, states that it feels better when she applies pressure.  States that her pmd sent her here because she has some fluid on her lung and a lung mass.

## 2020-11-12 NOTE — ED Notes (Signed)
ED Provider at bedside. 

## 2020-11-12 NOTE — Discharge Instructions (Addendum)
You have been seen here for left-sided rib pain.  Lab work shows possible infection started you on antibiotics please take as prescribed.  Your CT imaging shows that you have a 2.8 cm spiculated mass in the posterior lobe of your right lung. concerning for primary lung carcinoma.  Given you the contact info for an oncologist please call for further evaluation.  CT scan also shows that you have a mild aneurysm measuring up to 4.1 cm of your a sending aorta.  Please follow-up with thoracic surgery for further evaluation.  Come back to the emergency department if you develop chest pain, shortness of breath, severe abdominal pain, uncontrolled nausea, vomiting, diarrhea.

## 2020-11-13 NOTE — Progress Notes (Signed)
Office Visit Note  Sylvia [name]: Sylvia Sylvia [name]             Date of Birth: Oct 24, 1955           MRN: [id number]             PCP: Redmond School, MD Referring: Redmond School, MD Visit Date: 11/14/2020  Subjective:   History of Present Illness: Sylvia Sylvia [name] is a 65 y.o. female with a history of OA, GERD here for evaluation of elevated RF and arthralgias.  She has had joint pain for years but with recent worsening especially in the arms and hands bilaterally.  She feels there is sometimes swelling in the hands but also just chronic enlargement distally.  She has morning stiffness lasting up to 2 hours daily.  Evaluation for this included mildly positive rheumatoid factor in December.  More recently she developed a lot of pain in the left chest wall and left arm this is evaluated at the emergency department where no specific underlying cause was seen although CT chest did demonstrate a right upper lobe spiculated lung nodule.  She has been referred for oncology follow-up of this problem.  She denies any new rashes or skin nodules.  Labs reviewed 08/2020 RF 16.4 ANA neg ESR 16 Lyme IgM/IgG neg    Activities of Daily Living:  Sylvia [name] reports morning stiffness for 1.5-2 hours.   Sylvia [name] Reports nocturnal pain.  Difficulty dressing/grooming: Denies Difficulty climbing stairs: Reports Difficulty getting out of chair: Reports Difficulty using hands for taps, buttons, cutlery, and/or writing: Reports  Review of Systems  Constitutional: Positive for fatigue.  HENT: Negative for mouth sores, mouth dryness and nose dryness.   Eyes: Negative for pain, itching, visual disturbance and dryness.  Respiratory: Positive for cough, shortness of breath and difficulty breathing. Negative for hemoptysis.   Cardiovascular: Positive for chest pain. Negative for palpitations and swelling in legs/feet.  Gastrointestinal: Negative for abdominal pain, blood in stool, constipation and diarrhea.  Endocrine: Negative  for increased urination.  Genitourinary: Negative for painful urination.  Musculoskeletal: Positive for arthralgias, joint pain, joint swelling, myalgias, muscle weakness, morning stiffness, muscle tenderness and myalgias.  Skin: Negative for color change, rash and redness.  Allergic/Immunologic: Negative for susceptible to infections.  Neurological: Positive for dizziness, numbness, headaches, memory loss and weakness.  Hematological: Negative for swollen glands.  Psychiatric/Behavioral: Positive for depressed mood, confusion and sleep disturbance. The Sylvia [name] is nervous/anxious.     PMFS History:  Sylvia [name] Active Problem List   Diagnosis Date Noted  . Elevated rheumatoid factor 11/14/2020  . Arthralgia 11/14/2020  . Agitated depression ([place]) 03/22/2019  . Memory loss 11/14/2013  . Hyperlipemia   . RUQ pain 10/28/2011  . Epigastric pain 10/28/2011  . GERD (gastroesophageal reflux disease) 10/28/2011    Past Medical History:  Diagnosis Date  . Depression   . Diverticulosis   . GERD (gastroesophageal reflux disease)   . Hiatal hernia   . Hyperlipemia   . Hyperlipemia   . Memory loss   . Mental disorder   . Schatzki's ring     Family History  Problem Relation Age of Onset  . Heart attack Father   . Hyperlipidemia Father   . Depression Brother   . Colon cancer Neg Hx    Past Surgical History:  Procedure Laterality Date  . APPENDECTOMY  1973  . BACK SURGERY    . COLONOSCOPY  09/2011   colonic diverticulosis. Normal TI to 10cm. Next TCS 09/2021  . COLONOSCOPY  10/27/2011   Procedure: COLONOSCOPY;  Surgeon: Daneil Dolin, MD;  Location: [facility];  Service: Endoscopy;  Laterality: N/A;  12:15 PM  . ESOPHAGOGASTRODUODENOSCOPY  11/18/2011   erosive reflux esophagitis-noncritical schatzki ring, hiatal hernia  . ESOPHAGOGASTRODUODENOSCOPY  11/18/2011   Procedure: ESOPHAGOGASTRODUODENOSCOPY (EGD);  Surgeon: Daneil Dolin, MD;  Location: [facility];  Service: Endoscopy;   Laterality: N/A;  8:30  . PATELLA FRACTURE SURGERY    . 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   Sylvia [name] has three children.   Sylvia [name] has a high school education.   Sylvia [name] is right-handed.   Sylvia [name] drinks two cups of coffee every morning and very little tea.   Immunization History  Administered Date(s) Administered  . Moderna Sars-Covid-2 Vaccination 12/13/2019, 01/10/2020, 07/24/2020     Objective: Vital Signs: BP 115/81 (BP Location: Left Arm, Sylvia [name] Position: Sitting, Cuff Size: Normal)   Pulse 83   Ht 5' 0.5" (1.537 m)   Wt 128 lb 6.4 oz (58.2 kg)   BMI 24.66 kg/m    Physical Exam HENT:     Right Ear: External ear normal.     Left Ear: External ear normal.     Mouth/Throat:     Mouth: Mucous membranes are moist.     Pharynx: Oropharynx is clear.  Eyes:     Conjunctiva/sclera: Conjunctivae normal.  Cardiovascular:     Rate and Rhythm: Normal rate and regular rhythm.  Pulmonary:     Effort: Pulmonary effort is normal.     Comments: Bibasilar inspiratory crackles Skin:    General: Skin is warm and dry.     Findings: No rash.  Neurological:     General: No focal deficit present.     Mental Status: She is alert.  Psychiatric:     Comments: Pressured and circumferential speech     Musculoskeletal Exam:  Neck full ROM no tenderness Shoulders full ROM no tenderness or swelling left shoulder movements provokes left chest wall pain Elbows full ROM no tenderness or swelling Wrists full ROM no tenderness or swelling Fingers full ROM no tenderness or swelling, digital clubbing on fingers of both hands Knees full ROM no tenderness or swelling, crepitus present Ankles full ROM no tenderness or swelling MTPs full ROM no tenderness or swelling, digital clubbing of 1st toe    Investigation: No additional findings.  Imaging: DG Chest 2 View  Result Date:  11/12/2020 CLINICAL DATA:  Chest pain EXAM: CHEST - 2 VIEW COMPARISON:  09/17/2016 FINDINGS: 2.9 cm right upper lobe opacity with possible cavitation. Mild reticular interstitial opacities at the bases, could reflect chronic interstitial disease. Hazy atelectasis or mild infiltrate left lung base. Normal heart size. No pneumothorax. Left calcific tendinitis. IMPRESSION: 2.9 cm right upper lobe opacity with possible cavitation, question cavitary infection or cavitary mass. CT chest recommended for further evaluation. Fine reticular opacity at the bases, possible fibrosis. Hazy left lung base atelectasis or mild pneumonia Electronically Signed   By: Donavan Foil M.D.   On: 11/12/2020 22:03   CT Angio Chest PE W and/or Wo Contrast  Result Date: 11/12/2020 CLINICAL DATA:  Left-sided rib pain EXAM: CT ANGIOGRAPHY CHEST WITH CONTRAST TECHNIQUE: Multidetector CT imaging of the chest was performed using the standard protocol during bolus administration of intravenous contrast. Multiplanar CT image reconstructions and MIPs were obtained to evaluate the vascular anatomy. CONTRAST:  166m OMNIPAQUE IOHEXOL  350 MG/ML SOLN COMPARISON:  Chest x-ray 11/12/2020 FINDINGS: Cardiovascular: Satisfactory opacification of the pulmonary arteries to the segmental level. No evidence of pulmonary embolism. Mild aneurysmal dilatation of the ascending aorta measuring up to 4.1 cm. No dissection seen. Mild aortic atherosclerosis. Coronary vascular calcification. Normal cardiac size. No pericardial effusion Mediastinum/Nodes: Midline trachea. No suspicious thyroid mass. Borderline AP window lymph nodes measuring up to 9 mm. Esophagus within normal limits. Lungs/Pleura: Advanced emphysema. Spiculated cavitary mass within the posterior right upper lobe measures 2.8 by 2.3 by 2.4 cm. Partial consolidation within the lingula. Mild basilar fibrosis. Upper Abdomen: No acute abnormality. Calcified lesion partially visualized in the liver, possibly  due to remote trauma 2 or infection. Musculoskeletal: Subacute appearing right third anterior rib fracture. Review of the MIP images confirms the above findings. IMPRESSION: 1. Negative for acute pulmonary embolus or aortic dissection. 2. 2.8 cm spiculated cavitary mass in the posterior right upper lobe, concerning for primary lung carcinoma. Pulmonary consultation is recommended. 3. Advanced emphysema. Partial consolidation within the lingula, could reflect atelectasis or mild pneumonia. 4. Subacute appearing right third anterior rib fracture. 5. Emphysema and aortic atherosclerosis. 6. Mild aneurysmal dilatation of the ascending aorta up to 4.1 cm. Recommend annual imaging followup by CTA or MRA. This recommendation follows 2010 ACCF/AHA/AATS/ACR/ASA/SCA/SCAI/SIR/STS/SVM Guidelines for the Diagnosis and Management of Patients with Thoracic Aortic Disease. Circulation. 2010; 121: A193-X902. Aortic aneurysm NOS (ICD10-I71.9) Aortic aneurysm NOS (ICD10-I71.9).Aortic Atherosclerosis (ICD10-I70.0) and Emphysema (ICD10-J43.9). Electronically Signed   By: Donavan Foil M.D.   On: 11/12/2020 23:11   XR Hand 2 View Left  Result Date: 11/14/2020 X-ray left hand 2 views Radiocarpal joint space appears normal.  Ulnar styloid appears short.  Moderate first CMC joint arthritis and partial subluxation.  MCP joints appear normal.  PIP and DIP joint spaces appear normal small periarticular calcifications at second DIP possible shortening of some distal phalanges.  Generalized osteopenia.  No soft tissue swelling seen. Impression Osteoarthritis of first [place] joint and generalized osteopenia, no inflammatory arthritis changes seen  XR Hand 2 View Right  Result Date: 11/14/2020 X-ray right hand 2 views Radiocarpal joint space appears normal.  Ulnar styloid short.  Moderate first CMC joint arthritis with partial subluxation of metacarpal.  MCP joints appear normal.  PIP and DIP joint spaces appear intact slight periarticular  calcification at second DIP.  Generalized osteopenia.  No soft tissue swelling seen. Impression Moderate 1st CMC joint degenerative arthritis, generalized osteopenia, no inflammatory joint changes seen   Recent Labs: Lab Results  Component Value Date   WBC 7.0 11/12/2020   HGB 12.9 11/12/2020   PLT 258 11/12/2020   NA 134 (L) 11/12/2020   K 3.4 (L) 11/12/2020   CL 104 11/12/2020   CO2 24 11/12/2020   GLUCOSE 88 11/12/2020   BUN 16 11/12/2020   CREATININE 0.73 11/12/2020   BILITOT 1.1 07/22/2016   ALKPHOS 59 07/22/2016   AST 17 07/22/2016   ALT 15 07/22/2016   PROT 7.1 07/22/2016   ALBUMIN 3.9 07/22/2016   CALCIUM 9.0 11/12/2020   GFRAA >60 07/22/2016    Speciality Comments: No specialty comments available.  Procedures:  No procedures performed Allergies: Gabapentin   Assessment / Plan:     Visit Diagnoses: Elevated rheumatoid factor - Plan: Cyclic citrul peptide antibody, IgG, XR Hand 2 View Right, XR Hand 2 View Left  Mild positive rheumatoid factor but no inflammatory joint changes seen on exam today.  We will check CCP antibodies which are more specific.  Probable lung cancer based on appearance of nodule can be associated with rheumatoid arthritis but does not have disease clinically at this time.  Pain in other joint - Plan: Cyclic citrul peptide antibody, IgG, XR Hand 2 View Right, XR Hand 2 View Left  Pain of multiple joints especially the hands obtain x-rays bilaterally rule out erosive disease also for hypertrophic osteoarthropathy changes.  Orders: Orders Placed This Encounter  Procedures  . XR Hand 2 View Right  . XR Hand 2 View Left  . Cyclic citrul peptide antibody, IgG   No orders of the defined types were placed in this encounter.   Follow-Up Instructions: No follow-ups on file.   Collier Salina, MD  Note - This record has been created using [employer].  Chart creation errors have been sought, but may not always  have been located.  Such creation errors do not reflect on  the standard of medical care.

## 2020-11-14 ENCOUNTER — Ambulatory Visit: Payer: Self-pay

## 2020-11-14 ENCOUNTER — Encounter: Payer: Self-pay | Admitting: Internal Medicine

## 2020-11-14 ENCOUNTER — Other Ambulatory Visit: Payer: Self-pay

## 2020-11-14 ENCOUNTER — Ambulatory Visit: Payer: Medicare HMO | Admitting: Internal Medicine

## 2020-11-14 VITALS — BP 115/81 | HR 83 | Ht 60.5 in | Wt 128.4 lb

## 2020-11-14 DIAGNOSIS — R7689 Other specified abnormal immunological findings in serum: Secondary | ICD-10-CM

## 2020-11-14 DIAGNOSIS — M79641 Pain in right hand: Secondary | ICD-10-CM

## 2020-11-14 DIAGNOSIS — R768 Other specified abnormal immunological findings in serum: Secondary | ICD-10-CM | POA: Diagnosis not present

## 2020-11-14 DIAGNOSIS — M2559 Pain in other specified joint: Secondary | ICD-10-CM

## 2020-11-14 DIAGNOSIS — M79642 Pain in left hand: Secondary | ICD-10-CM | POA: Diagnosis not present

## 2020-11-14 DIAGNOSIS — M2550 Pain in unspecified joint: Secondary | ICD-10-CM | POA: Insufficient documentation

## 2020-11-14 NOTE — Patient Instructions (Addendum)
I do not see inflammatory arthritis changes on examination today and I believe rheumatoid arthritis is unlikely. I am checking a more specific antibody test to check this. Your hand joint changes look more like degenerative changes and clubbing and not the inflammatory kind of problem, but checking xrays to see this.  Your lung nodule has an appearance concerning for some type of cancer so should definitely follow up for this. Depending on the exact type identified there are various treatments. This can sometimes cause these type of joint changes and would benefit treating the underlying problem.  We will give you a call after reviewing these test results on whether any follow up or treatment for rheumatoid arthritis would be needed.    Anticyclic-Citrullinated Peptide Antibody Test Why am I having this test? You may have the anticyclic-citrullinated peptide antibody test done to help:  Diagnose rheumatoid arthritis (RA). RA is a long-term (chronic) disease that causes inflammation in the joints.  Determine the severity of your RA, including how much worse it is getting (progression). This test may be done if you have unexplained joint inflammation and have previously tested negative for rheumatoid factor. It may also be done if you have been diagnosed with undifferentiated arthritis and your health care provider suspects rheumatoid arthritis. What is being tested? This test checks your blood for the presence of anticyclic-citrullinated peptide antibodies. Antibodies are cells that are part of the body's disease-fighting (immune) system. These antibodies appear early in the course of RA and are thought to be directly involved in the progression of the disease. What kind of sample is taken? A blood sample is required for this test. It is usually collected by inserting a needle into a blood vessel.   How are the results reported? Your test results will be reported as either positive or negative. A  result is considered negative if there is less than 20 units of the antibody per mL of blood. What do the results mean?  A positive blood test may mean that you have RA.  A negative blood test means that it is less likely that you have RA. However, a negative test does not completely rule out rheumatoid arthritis. Talk with your health care provider about what your results mean. Questions to ask your health care provider Ask your health care provider, or the department that is doing the test:  When will my results be ready?  How will I get my results?  What are my treatment options?  What other tests do I need?  What are my next steps? Summary  The anticyclic-citrullinated peptide antibody blood test may be done to help your health care provider diagnose rheumatoid arthritis (RA).  This test checks your blood for the presence of anticyclic-citrullinated peptide antibodies. These antibodies appear early in the course of RA.  A positive blood test may mean that you have RA.

## 2020-11-17 LAB — CYCLIC CITRUL PEPTIDE ANTIBODY, IGG: Cyclic Citrullin Peptide Ab: <16 UNITS

## 2020-11-19 ENCOUNTER — Telehealth: Payer: Self-pay | Admitting: Radiology

## 2020-11-19 NOTE — Progress Notes (Signed)
Lab test is negative for more specific antibodies associated with rheumatoid arthritis. Xrays do not show rheumatoid arthritis either just degenerative joint changes. Based on this and our visit I do not believe RA is the cause of current symptoms and does not need new treatment or additional testing for this.

## 2020-11-19 NOTE — Telephone Encounter (Signed)
Attempted to call patient's spouse to get correct phone #- the phone number listed for patient is incorrect.

## 2020-12-03 DIAGNOSIS — C3492 Malignant neoplasm of unspecified part of left bronchus or lung: Secondary | ICD-10-CM | POA: Diagnosis not present

## 2020-12-03 DIAGNOSIS — R911 Solitary pulmonary nodule: Secondary | ICD-10-CM | POA: Diagnosis not present

## 2020-12-03 DIAGNOSIS — R55 Syncope and collapse: Secondary | ICD-10-CM | POA: Diagnosis not present

## 2020-12-03 DIAGNOSIS — C3411 Malignant neoplasm of upper lobe, right bronchus or lung: Secondary | ICD-10-CM | POA: Diagnosis not present

## 2020-12-09 ENCOUNTER — Ambulatory Visit (HOSPITAL_COMMUNITY): Payer: Medicare HMO | Admitting: Hematology

## 2020-12-09 DIAGNOSIS — R55 Syncope and collapse: Secondary | ICD-10-CM | POA: Diagnosis not present

## 2020-12-09 DIAGNOSIS — C3490 Malignant neoplasm of unspecified part of unspecified bronchus or lung: Secondary | ICD-10-CM | POA: Diagnosis not present

## 2020-12-09 DIAGNOSIS — C3411 Malignant neoplasm of upper lobe, right bronchus or lung: Secondary | ICD-10-CM | POA: Diagnosis not present

## 2020-12-09 DIAGNOSIS — I6789 Other cerebrovascular disease: Secondary | ICD-10-CM | POA: Diagnosis not present

## 2020-12-10 ENCOUNTER — Encounter: Payer: Medicare HMO | Admitting: Thoracic Surgery (Cardiothoracic Vascular Surgery)

## 2020-12-15 ENCOUNTER — Encounter: Payer: Medicare HMO | Admitting: Cardiothoracic Surgery

## 2020-12-16 DIAGNOSIS — F32A Depression, unspecified: Secondary | ICD-10-CM | POA: Diagnosis not present

## 2020-12-16 DIAGNOSIS — F419 Anxiety disorder, unspecified: Secondary | ICD-10-CM | POA: Diagnosis not present

## 2020-12-16 DIAGNOSIS — E7800 Pure hypercholesterolemia, unspecified: Secondary | ICD-10-CM | POA: Diagnosis not present

## 2020-12-16 DIAGNOSIS — C3411 Malignant neoplasm of upper lobe, right bronchus or lung: Secondary | ICD-10-CM | POA: Diagnosis not present

## 2020-12-16 DIAGNOSIS — E785 Hyperlipidemia, unspecified: Secondary | ICD-10-CM | POA: Diagnosis not present

## 2020-12-16 DIAGNOSIS — Z79899 Other long term (current) drug therapy: Secondary | ICD-10-CM | POA: Diagnosis not present

## 2020-12-16 DIAGNOSIS — E279 Disorder of adrenal gland, unspecified: Secondary | ICD-10-CM | POA: Diagnosis not present

## 2020-12-16 DIAGNOSIS — Z87891 Personal history of nicotine dependence: Secondary | ICD-10-CM | POA: Diagnosis not present

## 2020-12-16 DIAGNOSIS — R918 Other nonspecific abnormal finding of lung field: Secondary | ICD-10-CM | POA: Diagnosis not present

## 2020-12-16 DIAGNOSIS — F909 Attention-deficit hyperactivity disorder, unspecified type: Secondary | ICD-10-CM | POA: Diagnosis not present

## 2020-12-16 DIAGNOSIS — R911 Solitary pulmonary nodule: Secondary | ICD-10-CM | POA: Diagnosis not present

## 2020-12-29 DIAGNOSIS — C799 Secondary malignant neoplasm of unspecified site: Secondary | ICD-10-CM | POA: Diagnosis not present

## 2020-12-29 DIAGNOSIS — E279 Disorder of adrenal gland, unspecified: Secondary | ICD-10-CM | POA: Diagnosis not present

## 2020-12-29 DIAGNOSIS — C3411 Malignant neoplasm of upper lobe, right bronchus or lung: Secondary | ICD-10-CM | POA: Diagnosis not present

## 2020-12-29 DIAGNOSIS — M898X9 Other specified disorders of bone, unspecified site: Secondary | ICD-10-CM | POA: Diagnosis not present

## 2020-12-29 DIAGNOSIS — C7972 Secondary malignant neoplasm of left adrenal gland: Secondary | ICD-10-CM | POA: Diagnosis not present

## 2020-12-29 DIAGNOSIS — F17200 Nicotine dependence, unspecified, uncomplicated: Secondary | ICD-10-CM | POA: Diagnosis not present

## 2021-01-06 DIAGNOSIS — Z515 Encounter for palliative care: Secondary | ICD-10-CM | POA: Diagnosis not present

## 2021-01-06 DIAGNOSIS — C7971 Secondary malignant neoplasm of right adrenal gland: Secondary | ICD-10-CM | POA: Diagnosis not present

## 2021-01-06 DIAGNOSIS — C7951 Secondary malignant neoplasm of bone: Secondary | ICD-10-CM | POA: Diagnosis not present

## 2021-01-06 DIAGNOSIS — C771 Secondary and unspecified malignant neoplasm of intrathoracic lymph nodes: Secondary | ICD-10-CM | POA: Diagnosis not present

## 2021-01-06 DIAGNOSIS — C3411 Malignant neoplasm of upper lobe, right bronchus or lung: Secondary | ICD-10-CM | POA: Diagnosis not present

## 2021-01-07 DIAGNOSIS — Z7189 Other specified counseling: Secondary | ICD-10-CM | POA: Diagnosis not present

## 2021-01-07 DIAGNOSIS — R911 Solitary pulmonary nodule: Secondary | ICD-10-CM | POA: Diagnosis not present

## 2021-01-07 DIAGNOSIS — C7951 Secondary malignant neoplasm of bone: Secondary | ICD-10-CM | POA: Diagnosis not present

## 2021-01-07 DIAGNOSIS — R768 Other specified abnormal immunological findings in serum: Secondary | ICD-10-CM | POA: Diagnosis not present

## 2021-01-07 DIAGNOSIS — F902 Attention-deficit hyperactivity disorder, combined type: Secondary | ICD-10-CM | POA: Diagnosis not present

## 2021-01-07 DIAGNOSIS — F411 Generalized anxiety disorder: Secondary | ICD-10-CM | POA: Diagnosis not present

## 2021-01-07 DIAGNOSIS — C3411 Malignant neoplasm of upper lobe, right bronchus or lung: Secondary | ICD-10-CM | POA: Diagnosis not present

## 2021-01-08 DIAGNOSIS — C7951 Secondary malignant neoplasm of bone: Secondary | ICD-10-CM | POA: Diagnosis not present

## 2021-01-08 DIAGNOSIS — C3490 Malignant neoplasm of unspecified part of unspecified bronchus or lung: Secondary | ICD-10-CM | POA: Diagnosis not present

## 2021-01-08 DIAGNOSIS — G894 Chronic pain syndrome: Secondary | ICD-10-CM | POA: Diagnosis not present

## 2021-01-13 DIAGNOSIS — C3411 Malignant neoplasm of upper lobe, right bronchus or lung: Secondary | ICD-10-CM | POA: Diagnosis not present

## 2021-01-14 DIAGNOSIS — C3411 Malignant neoplasm of upper lobe, right bronchus or lung: Secondary | ICD-10-CM | POA: Diagnosis not present

## 2021-01-15 DIAGNOSIS — C3411 Malignant neoplasm of upper lobe, right bronchus or lung: Secondary | ICD-10-CM | POA: Diagnosis not present

## 2021-01-15 DIAGNOSIS — R911 Solitary pulmonary nodule: Secondary | ICD-10-CM | POA: Diagnosis not present

## 2021-01-17 DIAGNOSIS — C3411 Malignant neoplasm of upper lobe, right bronchus or lung: Secondary | ICD-10-CM | POA: Diagnosis not present

## 2021-01-19 DIAGNOSIS — C3411 Malignant neoplasm of upper lobe, right bronchus or lung: Secondary | ICD-10-CM | POA: Diagnosis not present

## 2021-07-23 ENCOUNTER — Encounter: Payer: Self-pay | Admitting: *Deleted

## 2021-12-10 ENCOUNTER — Encounter (HOSPITAL_COMMUNITY): Payer: Self-pay | Admitting: Radiology
